# Patient Record
Sex: Female | Born: 1940
Health system: Southern US, Community
[De-identification: ages and names within clinical notes are randomized; demographics above are authoritative.]

## PROBLEM LIST (undated history)

## (undated) DIAGNOSIS — M949 Disorder of cartilage, unspecified: Secondary | ICD-10-CM

## (undated) DIAGNOSIS — F32A Depression, unspecified: Secondary | ICD-10-CM

## (undated) DIAGNOSIS — C801 Malignant (primary) neoplasm, unspecified: Secondary | ICD-10-CM

## (undated) DIAGNOSIS — F329 Major depressive disorder, single episode, unspecified: Secondary | ICD-10-CM

## (undated) DIAGNOSIS — H409 Unspecified glaucoma: Secondary | ICD-10-CM

## (undated) DIAGNOSIS — G43909 Migraine, unspecified, not intractable, without status migrainosus: Secondary | ICD-10-CM

## (undated) DIAGNOSIS — S82899A Other fracture of unspecified lower leg, initial encounter for closed fracture: Secondary | ICD-10-CM

## (undated) DIAGNOSIS — N951 Menopausal and female climacteric states: Secondary | ICD-10-CM

## (undated) DIAGNOSIS — K219 Gastro-esophageal reflux disease without esophagitis: Secondary | ICD-10-CM

## (undated) DIAGNOSIS — S62608D Fracture of unspecified phalanx of other finger, subsequent encounter for fracture with routine healing: Secondary | ICD-10-CM

## (undated) DIAGNOSIS — E785 Hyperlipidemia, unspecified: Secondary | ICD-10-CM

## (undated) DIAGNOSIS — F419 Anxiety disorder, unspecified: Secondary | ICD-10-CM

## (undated) HISTORY — DX: Other fracture of unspecified lower leg, initial encounter for closed fracture: S82.899A

## (undated) HISTORY — DX: Gastro-esophageal reflux disease without esophagitis: K21.9

## (undated) HISTORY — PX: TUBAL LIGATION: SHX77

## (undated) HISTORY — DX: Hyperlipidemia, unspecified: E78.5

## (undated) HISTORY — PX: ABDOMINAL HYSTERECTOMY: SHX81

## (undated) HISTORY — PX: TONSILLECTOMY: SUR1361

## (undated) HISTORY — DX: Unspecified glaucoma: H40.9

## (undated) HISTORY — DX: Menopausal and female climacteric states: N95.1

## (undated) HISTORY — DX: Fracture of unspecified phalanx of other finger, subsequent encounter for fracture with routine healing: S62.608D

## (undated) HISTORY — DX: Disorder of cartilage, unspecified: M94.9

## (undated) HISTORY — DX: Migraine, unspecified, not intractable, without status migrainosus: G43.909

---

## 1999-10-27 ENCOUNTER — Other Ambulatory Visit: Admission: RE | Admit: 1999-10-27 | Discharge: 1999-10-27 | Payer: Self-pay | Admitting: Gynecology

## 2001-01-29 ENCOUNTER — Other Ambulatory Visit: Admission: RE | Admit: 2001-01-29 | Discharge: 2001-01-29 | Payer: Self-pay | Admitting: Gynecology

## 2001-08-20 ENCOUNTER — Encounter: Payer: Self-pay | Admitting: Internal Medicine

## 2001-08-20 ENCOUNTER — Ambulatory Visit (HOSPITAL_COMMUNITY): Admission: RE | Admit: 2001-08-20 | Discharge: 2001-08-20 | Payer: Self-pay | Admitting: Internal Medicine

## 2002-04-07 ENCOUNTER — Other Ambulatory Visit: Admission: RE | Admit: 2002-04-07 | Discharge: 2002-04-07 | Payer: Self-pay | Admitting: Gynecology

## 2003-05-21 ENCOUNTER — Other Ambulatory Visit: Admission: RE | Admit: 2003-05-21 | Discharge: 2003-05-21 | Payer: Self-pay | Admitting: Gynecology

## 2004-06-16 ENCOUNTER — Other Ambulatory Visit: Admission: RE | Admit: 2004-06-16 | Discharge: 2004-06-16 | Payer: Self-pay | Admitting: Gynecology

## 2005-01-05 ENCOUNTER — Ambulatory Visit (HOSPITAL_COMMUNITY): Admission: RE | Admit: 2005-01-05 | Discharge: 2005-01-05 | Payer: Self-pay | Admitting: Internal Medicine

## 2005-06-12 ENCOUNTER — Other Ambulatory Visit: Admission: RE | Admit: 2005-06-12 | Discharge: 2005-06-12 | Payer: Self-pay | Admitting: Gynecology

## 2006-04-30 ENCOUNTER — Ambulatory Visit (HOSPITAL_COMMUNITY): Admission: RE | Admit: 2006-04-30 | Discharge: 2006-04-30 | Payer: Self-pay | Admitting: Internal Medicine

## 2006-04-30 ENCOUNTER — Ambulatory Visit: Payer: Self-pay | Admitting: Internal Medicine

## 2006-06-18 ENCOUNTER — Other Ambulatory Visit: Admission: RE | Admit: 2006-06-18 | Discharge: 2006-06-18 | Payer: Self-pay | Admitting: Gynecology

## 2006-09-04 ENCOUNTER — Ambulatory Visit (HOSPITAL_COMMUNITY): Admission: RE | Admit: 2006-09-04 | Discharge: 2006-09-04 | Payer: Self-pay | Admitting: Internal Medicine

## 2007-06-07 ENCOUNTER — Ambulatory Visit (HOSPITAL_COMMUNITY): Admission: RE | Admit: 2007-06-07 | Discharge: 2007-06-07 | Payer: Self-pay | Admitting: Internal Medicine

## 2007-06-25 ENCOUNTER — Other Ambulatory Visit: Admission: RE | Admit: 2007-06-25 | Discharge: 2007-06-25 | Payer: Self-pay | Admitting: Gynecology

## 2007-07-01 ENCOUNTER — Ambulatory Visit (HOSPITAL_COMMUNITY): Admission: RE | Admit: 2007-07-01 | Discharge: 2007-07-01 | Payer: Self-pay | Admitting: Ophthalmology

## 2007-09-09 ENCOUNTER — Ambulatory Visit (HOSPITAL_COMMUNITY): Admission: RE | Admit: 2007-09-09 | Discharge: 2007-09-09 | Payer: Self-pay | Admitting: Ophthalmology

## 2008-05-04 ENCOUNTER — Ambulatory Visit (HOSPITAL_COMMUNITY): Admission: RE | Admit: 2008-05-04 | Discharge: 2008-05-04 | Payer: Self-pay | Admitting: Internal Medicine

## 2008-06-10 ENCOUNTER — Ambulatory Visit (HOSPITAL_COMMUNITY): Admission: RE | Admit: 2008-06-10 | Discharge: 2008-06-10 | Payer: Self-pay | Admitting: Internal Medicine

## 2010-04-01 ENCOUNTER — Ambulatory Visit (HOSPITAL_COMMUNITY): Admission: RE | Admit: 2010-04-01 | Discharge: 2010-04-01 | Payer: Self-pay | Admitting: Internal Medicine

## 2011-02-10 NOTE — Op Note (Signed)
NAMEBRENAE, LASECKI                  ACCOUNT NO.:  [id number]   MEDICAL RECORD NO.:  [id number]          PATIENT TYPE:  AMB   LOCATION:  DAY                           FACILITY:  [facility]   PHYSICIAN:  Lionel December, M.D.    DATE OF BIRTH:  [birth year]   DATE OF PROCEDURE:  04/30/2006  DATE OF DISCHARGE:                                 OPERATIVE REPORT   PROCEDURE:  Colonoscopy.   Bridget Gay is a 70 year old Caucasian female who is here for high-risk screening  colonoscopy. Her last exam was 6 years ago. Her father died of colon  carcinoma. However, today she states that she is not convinced that he was  her biologic dad but she is agreeable to proceeding with colonoscopy. The  procedure was reviewed with the patient and informed consent was obtained.   MEDICATIONS FOR CONSCIOUS SEDATION:  Demerol 50 mg IV, Versed 5 mg IV.   FINDINGS:  Procedure performed in endoscopy suite. The patient's vital signs  and O2 saturations were monitored during the procedure and remained stable.  The patient was placed in the left lateral decubitus position and rectal  examination performed. No abnormalities noted on external or digital exam.  The Olympus videoscope was placed in the rectum and advanced under direct  vision into the sigmoid colon and beyond. The prep was excellent.  She had a  few small scattered diverticula at the sigmoid colon. The scope was passed  to the cecum which was identified by the appendiceal orifice and ileocecal  valve. Pictures taken for the record. Colonic mucosa was examined for the  second time on the way out and was normal throughout. The rectal mucosa was  normal. The scope was retroflexed to examine the anorectal junction and two  anal papilla were noted. Once again pictures taken for the record. The  endoscope was straightened and withdrawn. The patient tolerated the  procedure well.   FINAL DIAGNOSES:  A few small diverticula at the sigmoid colon and 2 anal  papilla,  otherwise normal exam.   RECOMMENDATIONS:  1.  High fiber diet.  2.  Yearly Hemoccults.   She may consider next exam in 5 years if she believes her family history,  otherwise come back in  10 years.      Lionel December, M.D.  Electronically Signed     NR/MEDQ  D:  04/30/2006  T:  04/30/2006  Job:  [id number]   cc:   Kingsley Callander. Ouida Sills, MD  Fax: [phone]

## 2011-05-03 ENCOUNTER — Encounter (INDEPENDENT_AMBULATORY_CARE_PROVIDER_SITE_OTHER): Payer: Self-pay | Admitting: *Deleted

## 2012-04-02 ENCOUNTER — Other Ambulatory Visit (HOSPITAL_COMMUNITY): Payer: Self-pay | Admitting: Internal Medicine

## 2012-04-02 DIAGNOSIS — Z139 Encounter for screening, unspecified: Secondary | ICD-10-CM

## 2012-04-08 ENCOUNTER — Ambulatory Visit (HOSPITAL_COMMUNITY)
Admission: RE | Admit: 2012-04-08 | Discharge: 2012-04-08 | Disposition: A | Discharge status: Home / Self Care | Payer: Medicare Other | Source: Ambulatory Visit | Attending: Internal Medicine | Admitting: Internal Medicine

## 2012-04-08 DIAGNOSIS — Z139 Encounter for screening, unspecified: Secondary | ICD-10-CM

## 2012-04-08 DIAGNOSIS — Z1231 Encounter for screening mammogram for malignant neoplasm of breast: Secondary | ICD-10-CM | POA: Insufficient documentation

## 2012-04-11 ENCOUNTER — Encounter (INDEPENDENT_AMBULATORY_CARE_PROVIDER_SITE_OTHER): Payer: Self-pay | Admitting: *Deleted

## 2012-09-06 ENCOUNTER — Other Ambulatory Visit (HOSPITAL_COMMUNITY): Payer: Self-pay | Admitting: Internal Medicine

## 2012-09-06 ENCOUNTER — Ambulatory Visit (HOSPITAL_COMMUNITY)
Admission: RE | Admit: 2012-09-06 | Discharge: 2012-09-06 | Disposition: A | Discharge status: Home / Self Care | Payer: Medicare Other | Source: Ambulatory Visit | Attending: Internal Medicine | Admitting: Internal Medicine

## 2012-09-06 DIAGNOSIS — M549 Dorsalgia, unspecified: Secondary | ICD-10-CM

## 2013-01-31 ENCOUNTER — Encounter (INDEPENDENT_AMBULATORY_CARE_PROVIDER_SITE_OTHER): Payer: Self-pay | Admitting: *Deleted

## 2013-02-06 ENCOUNTER — Encounter (INDEPENDENT_AMBULATORY_CARE_PROVIDER_SITE_OTHER): Payer: Self-pay | Admitting: *Deleted

## 2013-02-06 ENCOUNTER — Telehealth (INDEPENDENT_AMBULATORY_CARE_PROVIDER_SITE_OTHER): Payer: Self-pay | Admitting: *Deleted

## 2013-02-06 ENCOUNTER — Other Ambulatory Visit (INDEPENDENT_AMBULATORY_CARE_PROVIDER_SITE_OTHER): Payer: Self-pay | Admitting: *Deleted

## 2013-02-06 DIAGNOSIS — Z800 Family history of malignant neoplasm of digestive organs: Secondary | ICD-10-CM

## 2013-02-06 DIAGNOSIS — Z1211 Encounter for screening for malignant neoplasm of colon: Secondary | ICD-10-CM

## 2013-02-06 NOTE — Telephone Encounter (Signed)
Patient needs movi prep 

## 2013-02-07 MED ORDER — PEG-KCL-NACL-NASULF-NA ASC-C 100 G PO SOLR: 1.0000 | Freq: Once | ORAL | Status: DC

## 2013-03-11 ENCOUNTER — Telehealth (INDEPENDENT_AMBULATORY_CARE_PROVIDER_SITE_OTHER): Payer: Self-pay | Admitting: *Deleted

## 2013-03-11 NOTE — Telephone Encounter (Signed)
  Procedure: tcs  Reason/Indication:  fam hx colon ca  Has patient had this procedure before?  Yes, 2007 (EPIC)  If so, when, by whom and where?    Is there a family history of colon cancer?  Yes, father  Who?  What age when diagnosed?    Is patient diabetic?   no      Does patient have prosthetic heart valve?  no  Do you have a pacemaker?  no  Has patient ever had endocarditis? no  Has patient had joint replacement within last 12 months?  no  Is patient on Coumadin, Plavix and/or Aspirin? no  Medications: ibuprofen prn, welbutrin 150 mg bid, cetirizine 10 mg daily, chlordiazepoxide 25 mg prn, timolol eye drop bid  Allergies: sulfur  Medication Adjustment:   Procedure date & time: 04/10/13 at 830

## 2013-03-11 NOTE — Telephone Encounter (Signed)
agree

## 2013-03-17 ENCOUNTER — Encounter (HOSPITAL_COMMUNITY): Payer: Self-pay | Admitting: Pharmacy Technician

## 2013-04-10 ENCOUNTER — Encounter (HOSPITAL_COMMUNITY)
Admission: RE | Disposition: A | Discharge status: Home / Self Care | Payer: Self-pay | Source: Ambulatory Visit | Attending: Internal Medicine

## 2013-04-10 ENCOUNTER — Ambulatory Visit (HOSPITAL_COMMUNITY)
Admission: RE | Admit: 2013-04-10 | Discharge: 2013-04-10 | Disposition: A | Discharge status: Home / Self Care | Payer: Medicare PPO | Source: Ambulatory Visit | Attending: Internal Medicine | Admitting: Internal Medicine

## 2013-04-10 DIAGNOSIS — Z1211 Encounter for screening for malignant neoplasm of colon: Secondary | ICD-10-CM | POA: Insufficient documentation

## 2013-04-10 DIAGNOSIS — K5730 Diverticulosis of large intestine without perforation or abscess without bleeding: Secondary | ICD-10-CM | POA: Insufficient documentation

## 2013-04-10 DIAGNOSIS — K589 Irritable bowel syndrome without diarrhea: Secondary | ICD-10-CM | POA: Insufficient documentation

## 2013-04-10 DIAGNOSIS — Z882 Allergy status to sulfonamides status: Secondary | ICD-10-CM | POA: Insufficient documentation

## 2013-04-10 DIAGNOSIS — Z79899 Other long term (current) drug therapy: Secondary | ICD-10-CM | POA: Insufficient documentation

## 2013-04-10 DIAGNOSIS — K644 Residual hemorrhoidal skin tags: Secondary | ICD-10-CM

## 2013-04-10 DIAGNOSIS — K6389 Other specified diseases of intestine: Secondary | ICD-10-CM

## 2013-04-10 DIAGNOSIS — Z800 Family history of malignant neoplasm of digestive organs: Secondary | ICD-10-CM

## 2013-04-10 HISTORY — PX: COLONOSCOPY: SHX5424

## 2013-04-10 SURGERY — COLONOSCOPY
Anesthesia: Moderate Sedation

## 2013-04-10 MED ORDER — MEPERIDINE HCL 50 MG/ML IJ SOLN
INTRAMUSCULAR | Status: AC
  Filled 2013-04-10: qty 1

## 2013-04-10 MED ORDER — MEPERIDINE HCL 50 MG/ML IJ SOLN
INTRAMUSCULAR | Status: DC | PRN
  Administered 2013-04-10 (×2): 25 mg via INTRAVENOUS

## 2013-04-10 MED ORDER — MIDAZOLAM HCL 5 MG/5ML IJ SOLN
INTRAMUSCULAR | Status: AC
  Filled 2013-04-10: qty 10

## 2013-04-10 MED ORDER — SODIUM CHLORIDE 0.9 % IV SOLN
INTRAVENOUS | Status: DC
  Administered 2013-04-10: 08:00:00 via INTRAVENOUS

## 2013-04-10 MED ORDER — STERILE WATER FOR IRRIGATION IR SOLN
Status: DC | PRN
  Administered 2013-04-10: 08:00:00

## 2013-04-10 MED ORDER — MIDAZOLAM HCL 5 MG/5ML IJ SOLN
INTRAMUSCULAR | Status: DC | PRN
  Administered 2013-04-10 (×3): 2 mg via INTRAVENOUS

## 2013-04-10 NOTE — H&P (Addendum)
Bridget Gay is an 72 y.o. female.   Chief Complaint: Patient's here for colonoscopy. HPI: Patient 72 year old Caucasian female who is in for high risk screening colonoscopy. Patient's last exam was in August 2007. She denies abdominal pain or rectal bleeding. She has diarrhea and/or constipation. She has history of IBS. Family history significant for colon carcinoma in her father who died of liver metastases at age 68.  No past medical history on file.  No past surgical history on file.  No family history on file. Social History:  has no tobacco, alcohol, and drug history on file.  Allergies:  Allergies  Allergen Reactions  . Sulfa Antibiotics     unknown    Medications Prior to Admission  Medication Sig Dispense Refill  . buPROPion (WELLBUTRIN SR) 150 MG 12 hr tablet Take 150 mg by mouth 3 (three) times daily.      . cetirizine (ZYRTEC) 10 MG tablet Take 10 mg by mouth daily.        No results found for this or any previous visit (from the past 48 hour(s)). No results found.  ROS  Blood pressure 121/70, pulse 66, temperature 97.7 F (36.5 C), temperature source Oral, resp. rate 18, height 5\' 2"  (1.575 m), weight 145 lb (65.772 kg), SpO2 92.00%. Physical Exam  Constitutional: She appears well-developed and well-nourished.  HENT:  Mouth/Throat: Oropharynx is clear and moist.  Eyes: Conjunctivae are normal.  Neck: No thyromegaly present.  Cardiovascular: Normal rate, regular rhythm and normal heart sounds.   No murmur heard. Respiratory: Effort normal and breath sounds normal.  GI: Soft. She exhibits no distension and no mass. There is no tenderness.  Musculoskeletal: She exhibits no edema.  Lymphadenopathy:    She has no cervical adenopathy.  Neurological: She is alert.  Skin: Skin is warm and dry.     Assessment/Plan High-risk screening colonoscopy.  Kamyrah Feeser U 04/10/2013, 8:23 AM

## 2013-04-10 NOTE — Op Note (Signed)
COLONOSCOPY PROCEDURE REPORT  PATIENT:  Bridget Gay  MR#:  [id number] Birthdate:  [birth year], 72 y.o., female Endoscopist:  Dr. Malissa Hippo, MD Referred By:  Dr. Carylon Perches, MD Procedure Date: 04/10/2013  Procedure:   Colonoscopy  Indications:  Patient is 72 year old Caucasian female was undergoing screening colonoscopy. Family history significant for colon carcinoma in her father who died of metastatic disease at age 29. He was 51 at the time of diagnosis.  Informed Consent:  The procedure and risks were reviewed with the patient and informed consent was obtained.  Medications:  Demerol 50 mg IV Versed 6 mg IV  Description of procedure:  After a digital rectal exam was performed, that colonoscope was advanced from the anus through the rectum and colon to the area of the cecum, ileocecal valve and appendiceal orifice. The cecum was deeply intubated. These structures were well-seen and photographed for the record. From the level of the cecum and ileocecal valve, the scope was slowly and cautiously withdrawn. The mucosal surfaces were carefully surveyed utilizing scope tip to flexion to facilitate fold flattening as needed. The scope was pulled down into the rectum where a thorough exam including retroflexion was performed.  Findings:   Prep satisfactory. Small scattered diverticula at sigmoid colon. Normal rectal mucosa. Two small anal papillae and hemorrhoids below the dentate line.   Therapeutic/Diagnostic Maneuvers Performed:  None  Complications:  None  Cecal Withdrawal Time:  10 minutes  Impression:  Examination performed to cecum. Mild sigmoid colon diverticulosis. Small external hemorrhoids and 2 anal papillae. No evidence of colonic polyps.  Recommendations:  Standard instructions given. Next screening exam in 5 years.  [name]  04/10/2013 9:00 AM  CC: Dr. Carylon Perches, MD & Dr. Bonnetta Barry ref. provider found

## 2013-04-11 ENCOUNTER — Other Ambulatory Visit (HOSPITAL_COMMUNITY): Payer: Self-pay | Admitting: Internal Medicine

## 2013-04-11 DIAGNOSIS — Z139 Encounter for screening, unspecified: Secondary | ICD-10-CM

## 2013-04-15 ENCOUNTER — Ambulatory Visit (HOSPITAL_COMMUNITY)
Admission: RE | Admit: 2013-04-15 | Discharge: 2013-04-15 | Disposition: A | Discharge status: Home / Self Care | Payer: Medicare PPO | Source: Ambulatory Visit | Attending: Internal Medicine | Admitting: Internal Medicine

## 2013-04-15 ENCOUNTER — Encounter (HOSPITAL_COMMUNITY): Payer: Self-pay | Admitting: Internal Medicine

## 2013-04-15 DIAGNOSIS — Z139 Encounter for screening, unspecified: Secondary | ICD-10-CM

## 2013-04-15 DIAGNOSIS — Z1231 Encounter for screening mammogram for malignant neoplasm of breast: Secondary | ICD-10-CM | POA: Insufficient documentation

## 2014-04-17 ENCOUNTER — Other Ambulatory Visit (HOSPITAL_COMMUNITY): Payer: Self-pay | Admitting: Internal Medicine

## 2014-04-17 DIAGNOSIS — Z1231 Encounter for screening mammogram for malignant neoplasm of breast: Secondary | ICD-10-CM

## 2014-04-23 ENCOUNTER — Ambulatory Visit (HOSPITAL_COMMUNITY): Payer: Medicare PPO

## 2014-10-14 DIAGNOSIS — H26493 Other secondary cataract, bilateral: Secondary | ICD-10-CM | POA: Diagnosis not present

## 2014-10-14 DIAGNOSIS — H4011X2 Primary open-angle glaucoma, moderate stage: Secondary | ICD-10-CM | POA: Diagnosis not present

## 2014-10-14 DIAGNOSIS — H4011X1 Primary open-angle glaucoma, mild stage: Secondary | ICD-10-CM | POA: Diagnosis not present

## 2014-10-14 DIAGNOSIS — Z961 Presence of intraocular lens: Secondary | ICD-10-CM | POA: Diagnosis not present

## 2014-12-21 ENCOUNTER — Ambulatory Visit (INDEPENDENT_AMBULATORY_CARE_PROVIDER_SITE_OTHER): Payer: Medicare PPO | Admitting: Internal Medicine

## 2014-12-21 ENCOUNTER — Encounter (INDEPENDENT_AMBULATORY_CARE_PROVIDER_SITE_OTHER): Payer: Self-pay | Admitting: Internal Medicine

## 2014-12-21 VITALS — BP 124/70 | HR 76 | Temp 97.5°F | Ht 62.0 in | Wt 142.0 lb

## 2014-12-21 DIAGNOSIS — R197 Diarrhea, unspecified: Secondary | ICD-10-CM | POA: Diagnosis not present

## 2014-12-21 NOTE — Patient Instructions (Signed)
GI pathogen. Imodium one in am and one in pm.

## 2014-12-21 NOTE — Progress Notes (Addendum)
   Subjective:    Patient ID: Bridget Gay, female    DOB: September 07, 1941, 74 y.o.   MRN: [id number]  HPI 74 yr old female presenting today with c/o of not having a solid stool since 12/10/2014. The diarrhea started gradually started in January. She is having several stools a day. The worst is immediately after lunch. There has been no BRRB or melena.  She has had one solid stool today and one solid stool yesterday. She is not having diarrhea everyday but she says almost every day. She has been incontinent x 1 while in a public place.  She traveled to [place] in February 13 thru March 2nd. She did not have diarrhea while on her trip. She says with the diarrhea she will have migraines.  Appetite is good for the most part. No weight loss. She also says she has frequent acid reflux.   She takes OTC Zantac as needed. No abdominal pain today. She has rt sided abdominal pain just before her BMs. She also has a lot of dizziness when she turns over.    04/10/2013: Colonoscopy Indications: Patient is 74 year old Caucasian female was undergoing screening colonoscopy. Family history significant for colon carcinoma in her father who died of metastatic disease at age 56. He was 51 at the time of diagnosis. Impression:  Examination performed to cecum. Mild sigmoid colon diverticulosis. Small external hemorrhoids and 2 anal papillae. No evidence of colonic polyps.  Review of Systems     Objective:   Physical Exam Blood pressure 124/70, pulse 76, temperature 97.5 F (36.4 C), height 5\' 2"  (1.575 m), weight 142 lb (64.411 kg). Alert and oriented. Skin warm and dry. Oral mucosa is moist.   . Sclera anicteric, conjunctivae is pink. Thyroid not enlarged. No cervical lymphadenopathy. Lungs clear. Heart regular rate and rhythm.  Abdomen is soft. Bowel sounds are positive. No hepatomegaly. No abdominal masses felt. No tenderness.  No edema to lower extremities.          Assessment & Plan:  Diarrhea.  Possible IBS. Will get GI pathogen. CBC and CRP Imodium BID.

## 2014-12-22 LAB — GASTROINTESTINAL PATHOGEN PANEL PCR
C. difficile Tox A/B, PCR: NEGATIVE
CRYPTOSPORIDIUM, PCR: NEGATIVE
Campylobacter, PCR: NEGATIVE
E COLI (STEC) STX1/STX2, PCR: NEGATIVE
E COLI 0157, PCR: NEGATIVE
E coli (ETEC) LT/ST PCR: NEGATIVE
GIARDIA LAMBLIA, PCR: NEGATIVE
NOROVIRUS, PCR: NEGATIVE
Rotavirus A, PCR: NEGATIVE
SALMONELLA, PCR: NEGATIVE
Shigella, PCR: NEGATIVE

## 2014-12-22 LAB — CBC WITH DIFFERENTIAL/PLATELET
BASOS ABS: 0 10*3/uL (ref 0.0–0.1)
Basophils Relative: 0 % (ref 0–1)
EOS ABS: 0.1 10*3/uL (ref 0.0–0.7)
Eosinophils Relative: 1 % (ref 0–5)
HEMATOCRIT: 37.6 % (ref 36.0–46.0)
HEMOGLOBIN: 12.4 g/dL (ref 12.0–15.0)
LYMPHS PCT: 20 % (ref 12–46)
Lymphs Abs: 1.6 10*3/uL (ref 0.7–4.0)
MCH: 29.9 pg (ref 26.0–34.0)
MCHC: 33 g/dL (ref 30.0–36.0)
MCV: 90.6 fL (ref 78.0–100.0)
MPV: 11.4 fL (ref 8.6–12.4)
Monocytes Absolute: 0.7 10*3/uL (ref 0.1–1.0)
Monocytes Relative: 9 % (ref 3–12)
Neutro Abs: 5.6 10*3/uL (ref 1.7–7.7)
Neutrophils Relative %: 70 % (ref 43–77)
PLATELETS: 281 10*3/uL (ref 150–400)
RBC: 4.15 MIL/uL (ref 3.87–5.11)
RDW: 13.8 % (ref 11.5–15.5)
WBC: 8 10*3/uL (ref 4.0–10.5)

## 2014-12-22 LAB — C-REACTIVE PROTEIN: CRP: <0.5 mg/dL (ref <0.60)

## 2014-12-29 ENCOUNTER — Telehealth (INDEPENDENT_AMBULATORY_CARE_PROVIDER_SITE_OTHER): Payer: Self-pay | Admitting: *Deleted

## 2014-12-29 NOTE — Telephone Encounter (Signed)
Patient called in and was worried about her results since she hadn't heard in a week.  I glanced at them and they appeared OK however I told her I would give message to you that she called and if we needed to give her additional information we would let her know.

## 2014-12-29 NOTE — Telephone Encounter (Signed)
This message is for Ms.Setzer to address

## 2014-12-30 NOTE — Telephone Encounter (Signed)
I have spoken with patient earlier yesterday. She is better. She will call me the end of the week .

## 2014-12-30 NOTE — Telephone Encounter (Signed)
Results have been given to patient. She will call Thursday

## 2015-03-10 DIAGNOSIS — L821 Other seborrheic keratosis: Secondary | ICD-10-CM | POA: Diagnosis not present

## 2015-03-10 DIAGNOSIS — D1801 Hemangioma of skin and subcutaneous tissue: Secondary | ICD-10-CM | POA: Diagnosis not present

## 2015-03-10 DIAGNOSIS — L820 Inflamed seborrheic keratosis: Secondary | ICD-10-CM | POA: Diagnosis not present

## 2015-03-10 DIAGNOSIS — L814 Other melanin hyperpigmentation: Secondary | ICD-10-CM | POA: Diagnosis not present

## 2015-03-10 DIAGNOSIS — L570 Actinic keratosis: Secondary | ICD-10-CM | POA: Diagnosis not present

## 2015-04-09 DIAGNOSIS — Z79899 Other long term (current) drug therapy: Secondary | ICD-10-CM | POA: Diagnosis not present

## 2015-04-09 DIAGNOSIS — F329 Major depressive disorder, single episode, unspecified: Secondary | ICD-10-CM | POA: Diagnosis not present

## 2015-04-09 DIAGNOSIS — G43909 Migraine, unspecified, not intractable, without status migrainosus: Secondary | ICD-10-CM | POA: Diagnosis not present

## 2015-04-09 DIAGNOSIS — E785 Hyperlipidemia, unspecified: Secondary | ICD-10-CM | POA: Diagnosis not present

## 2015-04-21 DIAGNOSIS — H04123 Dry eye syndrome of bilateral lacrimal glands: Secondary | ICD-10-CM | POA: Diagnosis not present

## 2015-04-21 DIAGNOSIS — H4011X1 Primary open-angle glaucoma, mild stage: Secondary | ICD-10-CM | POA: Diagnosis not present

## 2015-04-21 DIAGNOSIS — H26493 Other secondary cataract, bilateral: Secondary | ICD-10-CM | POA: Diagnosis not present

## 2015-04-21 DIAGNOSIS — H10413 Chronic giant papillary conjunctivitis, bilateral: Secondary | ICD-10-CM | POA: Diagnosis not present

## 2015-04-21 DIAGNOSIS — Z961 Presence of intraocular lens: Secondary | ICD-10-CM | POA: Diagnosis not present

## 2015-04-21 DIAGNOSIS — G43909 Migraine, unspecified, not intractable, without status migrainosus: Secondary | ICD-10-CM | POA: Diagnosis not present

## 2015-04-22 DIAGNOSIS — G43909 Migraine, unspecified, not intractable, without status migrainosus: Secondary | ICD-10-CM | POA: Diagnosis not present

## 2015-04-22 DIAGNOSIS — Z0001 Encounter for general adult medical examination with abnormal findings: Secondary | ICD-10-CM | POA: Diagnosis not present

## 2015-04-22 DIAGNOSIS — Z6826 Body mass index (BMI) 26.0-26.9, adult: Secondary | ICD-10-CM | POA: Diagnosis not present

## 2015-04-22 DIAGNOSIS — H8113 Benign paroxysmal vertigo, bilateral: Secondary | ICD-10-CM | POA: Diagnosis not present

## 2015-05-05 ENCOUNTER — Ambulatory Visit (HOSPITAL_COMMUNITY)
Admission: RE | Admit: 2015-05-05 | Discharge: 2015-05-05 | Disposition: A | Discharge status: Home / Self Care | Payer: Medicare PPO | Source: Ambulatory Visit | Attending: Internal Medicine | Admitting: Internal Medicine

## 2015-05-05 DIAGNOSIS — Z1231 Encounter for screening mammogram for malignant neoplasm of breast: Secondary | ICD-10-CM | POA: Diagnosis not present

## 2015-07-06 DIAGNOSIS — Z23 Encounter for immunization: Secondary | ICD-10-CM | POA: Diagnosis not present

## 2015-08-30 DIAGNOSIS — R42 Dizziness and giddiness: Secondary | ICD-10-CM | POA: Diagnosis not present

## 2015-08-30 DIAGNOSIS — H903 Sensorineural hearing loss, bilateral: Secondary | ICD-10-CM | POA: Diagnosis not present

## 2015-08-30 DIAGNOSIS — H6123 Impacted cerumen, bilateral: Secondary | ICD-10-CM | POA: Diagnosis not present

## 2015-08-30 DIAGNOSIS — H8112 Benign paroxysmal vertigo, left ear: Secondary | ICD-10-CM | POA: Diagnosis not present

## 2016-06-13 ENCOUNTER — Ambulatory Visit (HOSPITAL_COMMUNITY): Payer: Medicare PPO | Attending: Physical Therapy | Admitting: Physical Therapy

## 2016-06-20 ENCOUNTER — Ambulatory Visit (HOSPITAL_COMMUNITY): Payer: Medicare Other | Attending: Orthopaedic Surgery | Admitting: Physical Therapy

## 2016-06-20 DIAGNOSIS — R29898 Other symptoms and signs involving the musculoskeletal system: Secondary | ICD-10-CM | POA: Diagnosis present

## 2016-06-20 DIAGNOSIS — M6281 Muscle weakness (generalized): Secondary | ICD-10-CM

## 2016-06-20 DIAGNOSIS — M545 Low back pain: Secondary | ICD-10-CM | POA: Diagnosis present

## 2016-06-20 NOTE — Therapy (Addendum)
[place], [place], [address] Phone: [phone]   Fax:  [phone]  Physical Therapy Evaluation  Patient Details  Name: Bridget Gay MRN: [id number] Date of Birth: 03-15-1941 Referring Provider: Joni Fears   Encounter Date: 06/20/2016      PT End of Session - 06/20/16 1726    Visit Number 1   Number of Visits 5   Date for PT Re-Evaluation 07/18/16   Authorization Type [employer] Time Period 06/20/16 to 07/25/16   PT Start Time 1517   PT Stop Time 1602   PT Time Calculation (min) 45 min   Activity Tolerance Patient tolerated treatment well   Behavior During Therapy [facility] for tasks assessed/performed      Past Medical History:  Diagnosis Date  . Fracture of unsp phalanx of finger, subs for fx w routn heal    from a fall this year  . Fx ankle     Past Surgical History:  Procedure Laterality Date  . COLONOSCOPY N/A 04/10/2013   Procedure: COLONOSCOPY;  Surgeon: Rogene Houston, MD;  Location: [facility]ITE;  Service: Endoscopy;  Laterality: N/A;  830    There were no vitals filed for this visit.       Subjective Assessment - 06/20/16 1521    Subjective Patient states that 2-2.5 years ago she was in [place] and she fell down some stairs head first, she hit her head and her hip. She still has a pocket of edema on her L hip about the size of a football. She recently had some x-rays done on her hip and it showed no fractures, however around this time her back started tingling- the next day she could not pick up a box from the floor and the next 3 days she spent in bed. She then went to her MD, who sent her to PT. Her back has been feeling better in general since the initial flare up, she is now feeling mostly tingling but no pain. Her back started bothering her about 2 weeks before labor day. Some tingling in the midline of her back, no incontinence/bowel/bladder issues. No falls  recently but she states that her balance is not what it once was, especially at night.    Pertinent History no significant PMH or PSH    How long can you sit comfortably? unlimited    How long can you stand comfortably? unlimited    How long can you walk comfortably? unlimited    Diagnostic tests unable to access imaging, however per patient MD says no bone damage and plenty of OA    Patient Stated Goals prevent recurrence of back pain    Currently in Pain? No/denies            [facility] PT Assessment - 06/20/16 0001      Assessment   Medical Diagnosis lumbar DDD    Referring Provider Joni Fears    Onset Date/Surgical Date --  mid-August    Next MD Visit none scheduled with Dr. Durward Fortes      Precautions   Precautions None   Precaution Comments no known precautions      Balance Screen   Has the patient fallen in the past 6 months Yes   How many times? 1- due to vertigo that is now resolved    Has the patient had a decrease in activity level because of a fear of falling?  No  Is the patient reluctant to leave their home because of a fear of falling?  No     Prior Function   Level of Independence Independent;Independent with basic ADLs;Independent with gait;Independent with transfers   Vocation Retired     Observation/Other Assessments   Observations large collection of fluid lateral R LE, which patient reports is chronic since her fall 2-2.5 years ago; tender to palpation on outer edges but not in the center      AROM   Overall AROM Comments hip ROM WFL B; however hip flexion and FABER (+) for groin pain R hip    Lumbar Flexion knuckles to floor    Lumbar Extension WFL    Lumbar - Right Side Bend fingertips to midline of knee    Lumbar - Left Side Bend fingertips approx 2 inches above knee midline      Strength   Right Hip Flexion 4/5   Right Hip ABduction 4-/5   Left Hip Flexion 4/5   Left Hip ABduction 4/5   Right Knee Flexion 4+/5   Right Knee Extension 4+/5    Left Knee Flexion 4+/5   Left Knee Extension 4+/5   Right Ankle Dorsiflexion 4+/5   Left Ankle Dorsiflexion 5/5     Palpation   Palpation comment hypomobility noted with lumbar, sacral, and low thoracic PAs     Ambulation/Gait   Gait Comments proximal muscle weakness      6 minute walk test results    Aerobic Endurance Distance Walked 748   Endurance additional comments 3MWT      High Level Balance   High Level Balance Comments SLS 4-6 seconds best B      No changes in symptoms with repeated movements (flexion or extension) of lumbar spine                       PT Education - 06/20/16 1726    Education provided Yes   Education Details prognosis, POC, HEP    Person(s) Educated Patient   Methods Explanation;Handout;Verbal cues   Comprehension Verbalized understanding;Returned demonstration;Need further instruction          PT Short Term Goals - 06/20/16 1736      PT SHORT TERM GOAL #1   Title Patient to demonstrate full lumbar ROM on all measured planes with no discomfort in order to improve general QOL and mobility    Time 2   Period Weeks   Status New     PT SHORT TERM GOAL #2   Title Patient to maintain correct posture at least 70% of the time in order to reduce lumbar discomfort and improve mechanics    Time 2   Period Weeks   Status New     PT SHORT TERM GOAL #3   Title Patient to be able to maintain SLS at least 20 seconds bilaterally with no UE support in order to show improved balance/reduced fall risk    Time 2   Period Weeks   Status New     PT SHORT TERM GOAL #4   Title Patient to correctly and consistently perform appropriate HEP, to be updated PRN    Time 1   Period Weeks   Status New           PT Long Term Goals - 06/20/16 1738      PT LONG TERM GOAL #1   Title Patient to demonstrate strength 5/5 in order to assist in improving functional mechanics and  preventing re-occurrence of pain    Time 5   Period Weeks    Status New     PT LONG TERM GOAL #2   Title Patient to be able to maintain SLS for 30 seconds bilaterally and will be able to identify at least 5 safety precautions/ways to prevent falls in order to show overall reduced fall risk and improved safety awareness    Time 5   Period Weeks   Status New     PT LONG TERM GOAL #3   Title Patient to demonstrate correct form with functional lifting tasks from the floor in order to prevent exacerbation of symptoms or re-injury of lumbar area    Time 5   Period Weeks   Status New     PT LONG TERM GOAL #4   Title Patient to be participatory in a regular aerobic exercise program, at least 20 minutes in duration, at least 4 days per week in order to maintain functional gains and improve general health status    Time 5   Period Weeks   Status New               Plan - 06/20/16 1733    Clinical Impression Statement Patient arrives today complaining of low back pain, which started in late August (approximately 2 weeks before Labor Day per patient), which started as a tingle and kept her in bed for approximately 3 days before she went to see her MD, who referred her to PT. She reports her back is feeling much better and that she is actually feeling good enough that she is wondering if she really still needs PT. Upon examination, patient reveals postural deviations, functional weakness especially proximally, hypomobility and some functional stiffness of her lumbar and thoracic spines/sacrum, and also significant unsteadiness. She does have a history of a major fall down a flight of stairs that occurred when she was travelling in [place], which left her with a concussion, some R hip pain as well as a large pocked of edema on her lateral R LE. She has never received an MRI for her low back or R hip per her report. At this time, recommend a short stint of skilled PT services to address functional limitations, minimize fall risk, and reach optimal level of  function.    Rehab Potential Good   Clinical Impairments Affecting Rehab Potential memory deficits related to prior concussion, history of major fall down stairs that left large pocket of edema lateral R LE    PT Frequency 1x / week   PT Duration Other (comment)  5 weeks    PT Treatment/Interventions ADLs/Self Care Home Management;Biofeedback;Cryotherapy;Moist Heat;Gait training;Stair training;Functional mobility training;Therapeutic activities;Therapeutic exercise;Balance training;Neuromuscular re-education;Patient/family education;Manual techniques;Passive range of motion;Energy conservation;Taping   PT Next Visit Plan review HEP and goals; proximal strength, lumbar PAs and manual to tolerance, lumbar mobility, balance. UPDATE HEP EACH SESSION.    PT Home Exercise Plan 09/26: bridge, supine hip ABD with red TB, sit to stand with no UEs, tandem stance    Consulted and Agree with Plan of Care Patient      Patient will benefit from skilled therapeutic intervention in order to improve the following deficits and impairments:  Abnormal gait, Improper body mechanics, Pain, Decreased coordination, Postural dysfunction, Decreased strength, Hypomobility, Decreased balance, Decreased safety awareness  Visit Diagnosis: Midline low back pain, with sciatica presence unspecified - Plan: PT plan of care cert/re-cert  Muscle weakness (generalized) - Plan: PT plan of care cert/re-cert  Other symptoms and signs involving the musculoskeletal system - Plan: PT plan of care cert/re-cert   G-codes Mobility and Walking Around 06/20/16 Based on strength, balance, gait, ROM Current CJ Goal CI   Problem List There are no active problems to display for this patient.   Deniece Ree PT, DPT [phone]  [place] [address] [place], [place], [address] Phone: [phone]   Fax:  [phone]  Name: AZENET DONATI MRN: [id number] Date of Birth: 1940/11/22

## 2016-06-20 NOTE — Patient Instructions (Signed)
    BRIDGING  While lying on your back, tighten your lower abdominals, squeeze your buttocks and then raise your buttocks off the floor/bed as creating a "Bridge" with your body. Hold for 1 second and then lower yourself and repeat 10-15 times, twice a day.   ELASTIC BAND - SUPINE HIP ABDUCTION  While lying on your back and your band around your ankles, slowly bring your leg out to the side. Keep  your knee straight the entire time.  Repeat 10 times each side, twice a day.     SIT TO STAND - NO SUPPORT  Start by scooting close to the front of the chair.  Next, lean forward at your trunk and reach forward with your arms and rise to standing without using your hands to push off from the chair or other object.  Repeat 10 times, twice a day.      TANDEM STANCE WITH SUPPORT  Stand in front of a chair, table or counter top for support. Then place the heel of one foot so that it is touching the toes of the other foot. Maintain your balance in this position for as long as you can.  Repeat twice each side, twice a day.

## 2016-06-28 ENCOUNTER — Ambulatory Visit (HOSPITAL_COMMUNITY): Payer: Self-pay

## 2016-06-28 ENCOUNTER — Telehealth (HOSPITAL_COMMUNITY): Payer: Self-pay

## 2016-06-28 NOTE — Telephone Encounter (Signed)
No show, called and spoke to pt who stated she had called previously and left message.  Stated she wishes to cancel all further apts., has been completeing HEP and feels better now.    [address] [place]; [facility] [phone]

## 2016-06-28 NOTE — Telephone Encounter (Signed)
10/4  Patient left [place] a message to say that she was cancelling today because she hadn't done her homework

## 2016-07-05 ENCOUNTER — Encounter (HOSPITAL_COMMUNITY): Payer: Self-pay | Admitting: Physical Therapy

## 2016-07-12 ENCOUNTER — Encounter (HOSPITAL_COMMUNITY): Payer: Self-pay | Admitting: Physical Therapy

## 2016-07-19 ENCOUNTER — Encounter (HOSPITAL_COMMUNITY): Payer: Self-pay | Admitting: Physical Therapy

## 2016-07-26 ENCOUNTER — Encounter (HOSPITAL_COMMUNITY): Payer: Self-pay | Admitting: Physical Therapy

## 2016-10-23 ENCOUNTER — Encounter (HOSPITAL_COMMUNITY): Payer: Self-pay | Admitting: Physical Therapy

## 2016-10-23 NOTE — Therapy (Signed)
[place], [place], [address] Phone: [phone]   Fax:  [phone]  Patient Details  Name: Bridget Gay MRN: [id number] Date of Birth: Jan 23, 1941 Referring Provider:  No ref. provider found  Encounter Date: 10/23/2016   PHYSICAL THERAPY DISCHARGE SUMMARY  Visits from Start of Care: 1  Current functional level related to goals / functional outcomes: Patient has not returned since last scheduled session    Remaining deficits: Unable to assess    Education / Equipment: N/A  Plan: Patient agrees to discharge.  Patient goals were not met. Patient is being discharged due to not returning since the last visit.  ?????     Deniece Ree PT, DPT [place] [address] [place], [place], [address] Phone: [phone]   Fax:  [phone]

## 2017-05-02 ENCOUNTER — Telehealth (INDEPENDENT_AMBULATORY_CARE_PROVIDER_SITE_OTHER): Payer: Self-pay | Admitting: Orthopaedic Surgery

## 2017-05-02 NOTE — Telephone Encounter (Signed)
Patient would like a copy of her xrays taken in September 2017 at [facility] office. Please call patient when ready to pick up. Patient would like to pick this up at the [facility] office.

## 2017-05-03 NOTE — Telephone Encounter (Signed)
LVMOM for Baldo Ash to get these ready and call pt

## 2017-05-22 ENCOUNTER — Other Ambulatory Visit (HOSPITAL_COMMUNITY): Payer: Self-pay | Admitting: Internal Medicine

## 2017-05-22 DIAGNOSIS — Z780 Asymptomatic menopausal state: Secondary | ICD-10-CM

## 2017-05-31 ENCOUNTER — Ambulatory Visit (HOSPITAL_COMMUNITY)
Admission: RE | Admit: 2017-05-31 | Discharge: 2017-05-31 | Disposition: A | Discharge status: Home / Self Care | Payer: Medicare Other | Source: Ambulatory Visit | Attending: Internal Medicine | Admitting: Internal Medicine

## 2017-05-31 ENCOUNTER — Other Ambulatory Visit (HOSPITAL_COMMUNITY): Payer: Self-pay | Admitting: Internal Medicine

## 2017-05-31 DIAGNOSIS — Z780 Asymptomatic menopausal state: Secondary | ICD-10-CM | POA: Diagnosis present

## 2017-05-31 DIAGNOSIS — Z1231 Encounter for screening mammogram for malignant neoplasm of breast: Secondary | ICD-10-CM

## 2017-05-31 DIAGNOSIS — M818 Other osteoporosis without current pathological fracture: Secondary | ICD-10-CM | POA: Diagnosis not present

## 2017-06-04 ENCOUNTER — Encounter (INDEPENDENT_AMBULATORY_CARE_PROVIDER_SITE_OTHER): Payer: Self-pay | Admitting: Internal Medicine

## 2017-06-05 ENCOUNTER — Encounter (INDEPENDENT_AMBULATORY_CARE_PROVIDER_SITE_OTHER): Payer: Self-pay | Admitting: *Deleted

## 2017-06-05 ENCOUNTER — Ambulatory Visit (INDEPENDENT_AMBULATORY_CARE_PROVIDER_SITE_OTHER): Payer: Medicare Other | Admitting: Internal Medicine

## 2017-06-05 ENCOUNTER — Other Ambulatory Visit (INDEPENDENT_AMBULATORY_CARE_PROVIDER_SITE_OTHER): Payer: Self-pay | Admitting: Internal Medicine

## 2017-06-05 ENCOUNTER — Encounter (INDEPENDENT_AMBULATORY_CARE_PROVIDER_SITE_OTHER): Payer: Self-pay | Admitting: Internal Medicine

## 2017-06-05 DIAGNOSIS — K219 Gastro-esophageal reflux disease without esophagitis: Secondary | ICD-10-CM | POA: Insufficient documentation

## 2017-06-05 HISTORY — DX: Gastro-esophageal reflux disease without esophagitis: K21.9

## 2017-06-05 NOTE — Patient Instructions (Signed)
EGD. GERD diet given to patient.

## 2017-06-05 NOTE — Progress Notes (Signed)
   Subjective:    Patient ID: Bridget Gay, female    DOB: [birth year], 76 y.o.   MRN: [id number]  HPI REferred by DR. Fagan for GERD/EGD.  She tells me she has a lot of reflux in the last 2 yrs. When she lies down, she has pressure in her sternal area.  Occasionally the reflux comes up into her mouth. She says the Protonix is helping her GERD. She has diarrhea and states the Protonix and Dicyclomine helps. Her appetite is good. No weight  Loss. Has a BM daily. No melena or BRRB. No NSAIDs  05/08/2017 H and H 12.3 and 37.3  Review of Systems Past Medical History:  Diagnosis Date  . Fracture of unsp phalanx of finger, subs for fx w routn heal    from a fall this year  . Fx ankle   . GERD (gastroesophageal reflux disease) 06/05/2017    Past Surgical History:  Procedure Laterality Date  . COLONOSCOPY N/A 04/10/2013   Procedure: COLONOSCOPY;  Surgeon: Rogene Houston, MD;  [facility];  Service: Endoscopy;  Laterality: N/A;  830    Allergies  Allergen Reactions  . Sulfa Antibiotics     unknown    Current Outpatient Prescriptions on File Prior to Visit  Medication Sig Dispense Refill  . buPROPion (WELLBUTRIN SR) 150 MG 12 hr tablet Take 150 mg by mouth 2 (two) times daily.     . cetirizine (ZYRTEC) 10 MG tablet Take 10 mg by mouth as needed.     . chlordiazePOXIDE (LIBRIUM) 25 MG capsule Take 25 mg by mouth as needed for anxiety.    . timolol (BETIMOL) 0.25 % ophthalmic solution 1 drop 2 (two) times daily.     No current facility-administered medications on file prior to visit.         Objective:   Physical Exam Blood pressure (!) 142/84, pulse 72, temperature 97.7 F (36.5 C), height 5\' 2"  (1.575 m), weight 146 lb 8 oz (66.5 kg). Alert and oriented. Skin warm and dry. Oral mucosa is moist.   . Sclera anicteric, conjunctivae is pink. Thyroid not enlarged. No cervical lymphadenopathy. Lungs clear. Heart regular rate and rhythm.  Abdomen is soft. Bowel sounds are  positive. No hepatomegaly. No abdominal masses felt. No tenderness.  No edema to lower extremities. Patient is alert and oriented.         Assessment & Plan:  GERD. Continue the Protonix.  GERD diet given to patient.

## 2017-06-13 ENCOUNTER — Ambulatory Visit (HOSPITAL_COMMUNITY)
Admission: RE | Admit: 2017-06-13 | Discharge: 2017-06-13 | Disposition: A | Discharge status: Home / Self Care | Payer: Medicare Other | Source: Ambulatory Visit | Attending: Internal Medicine | Admitting: Internal Medicine

## 2017-06-13 DIAGNOSIS — Z1231 Encounter for screening mammogram for malignant neoplasm of breast: Secondary | ICD-10-CM | POA: Insufficient documentation

## 2017-06-14 ENCOUNTER — Other Ambulatory Visit (HOSPITAL_COMMUNITY): Payer: Self-pay | Admitting: Internal Medicine

## 2017-06-14 DIAGNOSIS — R928 Other abnormal and inconclusive findings on diagnostic imaging of breast: Secondary | ICD-10-CM

## 2017-06-19 ENCOUNTER — Ambulatory Visit (HOSPITAL_COMMUNITY)
Admission: RE | Admit: 2017-06-19 | Discharge: 2017-06-19 | Disposition: A | Discharge status: Home / Self Care | Payer: Medicare Other | Source: Ambulatory Visit | Attending: Internal Medicine | Admitting: Internal Medicine

## 2017-06-19 DIAGNOSIS — R928 Other abnormal and inconclusive findings on diagnostic imaging of breast: Secondary | ICD-10-CM | POA: Diagnosis not present

## 2017-06-19 DIAGNOSIS — N6489 Other specified disorders of breast: Secondary | ICD-10-CM | POA: Diagnosis present

## 2017-06-29 ENCOUNTER — Encounter (HOSPITAL_COMMUNITY): Payer: Self-pay | Admitting: *Deleted

## 2017-06-29 ENCOUNTER — Ambulatory Visit (HOSPITAL_COMMUNITY)
Admission: RE | Admit: 2017-06-29 | Discharge: 2017-06-29 | Disposition: A | Discharge status: Home / Self Care | Payer: Medicare Other | Source: Ambulatory Visit | Attending: Internal Medicine | Admitting: Internal Medicine

## 2017-06-29 ENCOUNTER — Encounter (HOSPITAL_COMMUNITY)
Admission: RE | Disposition: A | Discharge status: Home / Self Care | Payer: Self-pay | Source: Ambulatory Visit | Attending: Internal Medicine

## 2017-06-29 DIAGNOSIS — Z882 Allergy status to sulfonamides status: Secondary | ICD-10-CM | POA: Insufficient documentation

## 2017-06-29 DIAGNOSIS — K219 Gastro-esophageal reflux disease without esophagitis: Secondary | ICD-10-CM

## 2017-06-29 DIAGNOSIS — F329 Major depressive disorder, single episode, unspecified: Secondary | ICD-10-CM | POA: Insufficient documentation

## 2017-06-29 DIAGNOSIS — F419 Anxiety disorder, unspecified: Secondary | ICD-10-CM | POA: Diagnosis not present

## 2017-06-29 DIAGNOSIS — K449 Diaphragmatic hernia without obstruction or gangrene: Secondary | ICD-10-CM

## 2017-06-29 DIAGNOSIS — Z881 Allergy status to other antibiotic agents status: Secondary | ICD-10-CM | POA: Insufficient documentation

## 2017-06-29 DIAGNOSIS — Z79899 Other long term (current) drug therapy: Secondary | ICD-10-CM | POA: Insufficient documentation

## 2017-06-29 DIAGNOSIS — R1314 Dysphagia, pharyngoesophageal phase: Secondary | ICD-10-CM

## 2017-06-29 HISTORY — PX: ESOPHAGOGASTRODUODENOSCOPY: SHX5428

## 2017-06-29 HISTORY — DX: Depression, unspecified: F32.A

## 2017-06-29 HISTORY — DX: Anxiety disorder, unspecified: F41.9

## 2017-06-29 HISTORY — DX: Major depressive disorder, single episode, unspecified: F32.9

## 2017-06-29 SURGERY — EGD (ESOPHAGOGASTRODUODENOSCOPY)
Anesthesia: Moderate Sedation

## 2017-06-29 MED ORDER — SODIUM CHLORIDE 0.9 % IV SOLN
INTRAVENOUS | Status: DC
  Administered 2017-06-29: 07:00:00 via INTRAVENOUS

## 2017-06-29 MED ORDER — MEPERIDINE HCL 50 MG/ML IJ SOLN
INTRAMUSCULAR | Status: DC | PRN
  Administered 2017-06-29 (×2): 25 mg via INTRAVENOUS

## 2017-06-29 MED ORDER — MIDAZOLAM HCL 5 MG/5ML IJ SOLN
INTRAMUSCULAR | Status: AC
  Filled 2017-06-29: qty 10

## 2017-06-29 MED ORDER — MEPERIDINE HCL 50 MG/ML IJ SOLN
INTRAMUSCULAR | Status: AC
  Filled 2017-06-29: qty 1

## 2017-06-29 MED ORDER — MIDAZOLAM HCL 5 MG/5ML IJ SOLN
INTRAMUSCULAR | Status: DC | PRN
  Administered 2017-06-29 (×2): 1 mg via INTRAVENOUS
  Administered 2017-06-29 (×3): 2 mg via INTRAVENOUS

## 2017-06-29 MED ORDER — LIDOCAINE VISCOUS 2 % MT SOLN
OROMUCOSAL | Status: AC
  Filled 2017-06-29: qty 15

## 2017-06-29 MED ORDER — LIDOCAINE VISCOUS 2 % MT SOLN
OROMUCOSAL | Status: DC | PRN
  Administered 2017-06-29: 1 via OROMUCOSAL

## 2017-06-29 NOTE — H&P (Signed)
Bridget Gay is an 76 y.o. female.   Chief Complaint: patient is here for EGD and ED. HPI: patient is 76 year old Caucasian female who presents with over a year history of heartburn. She has noted improvement with pantoprazole. She also has been expanse dysphagia primarily with meat. She points to lower cervical area at site of bolus obstruction. She feels as if she has a bubble there. She has good appetite. She denies weight loss or melena.  Past Medical History:  Diagnosis Date  . Anxiety   . Depression   . Fracture of unsp phalanx of finger, subs for fx w routn heal    from a fall this year  . Fx ankle   . GERD (gastroesophageal reflux disease) 06/05/2017    Past Surgical History:  Procedure Laterality Date  . ABDOMINAL HYSTERECTOMY    . COLONOSCOPY N/A 04/10/2013   Procedure: COLONOSCOPY;  Surgeon: Rogene Houston, MD;  Location: [facility];  Service: Endoscopy;  Laterality: N/A;  830  . TUBAL LIGATION      History reviewed. No pertinent family history. Social History:  reports that she has never smoked. She has never used smokeless tobacco. She reports that she drinks alcohol. She reports that she does not use drugs.  Allergies:  Allergies  Allergen Reactions  . Sulfa Antibiotics     unknown    Medications Prior to Admission  Medication Sig Dispense Refill  . buPROPion (WELLBUTRIN SR) 150 MG 12 hr tablet Take 150 mg by mouth 2 (two) times daily.     . cetirizine (ZYRTEC) 10 MG tablet Take 10 mg by mouth daily as needed for allergies.     . chlordiazePOXIDE (LIBRIUM) 25 MG capsule Take 25 mg by mouth as needed for anxiety.    . cycloSPORINE (RESTASIS) 0.05 % ophthalmic emulsion Place 1 drop into both eyes 2 (two) times daily.    Marland Kitchen dicyclomine (BENTYL) 10 MG capsule Take 10 mg by mouth 2 (two) times daily. MAY TAKE ADDITIONAL DOSE IF NEEDED    . latanoprost (XALATAN) 0.005 % ophthalmic solution Place 1 drop into both eyes at bedtime.     . pantoprazole (PROTONIX) 40 MG  tablet Take 40 mg by mouth daily.    . sertraline (ZOLOFT) 100 MG tablet Take 50 mg by mouth 2 (two) times daily.     . timolol (BETIMOL) 0.25 % ophthalmic solution Place 1 drop into both eyes every morning.     . loperamide (IMODIUM) 2 MG capsule Take 2 mg by mouth as needed for diarrhea or loose stools.      No results found for this or any previous visit (from the past 48 hour(s)). No results found.  ROS  Blood pressure 126/63, pulse 60, temperature 97.7 F (36.5 C), temperature source Oral, resp. rate 14, height 5\' 2"  (1.575 m), weight 146 lb (66.2 kg), SpO2 97 %. Physical Exam  Constitutional: She appears well-developed and well-nourished.  HENT:  Mouth/Throat: Oropharynx is clear and moist.  Eyes: Conjunctivae are normal. No scleral icterus.  Neck: No thyromegaly present.  Cardiovascular: Normal rate, regular rhythm and normal heart sounds.   No murmur heard. Respiratory: Effort normal and breath sounds normal.  GI: Soft. She exhibits no distension and no mass. There is tenderness (mild tenderness right mid abdomen.). There is no rebound.  Musculoskeletal: She exhibits no edema.  Lymphadenopathy:    She has no cervical adenopathy.  Neurological: She is alert.  Skin: Skin is warm and dry.  Assessment/Plan GERD of recent onset. Solid food dysphagia. EGD with ED.  Hildred Laser, MD 06/29/2017, 7:30 AM

## 2017-06-29 NOTE — Op Note (Signed)
[facility] Patient Name: Bridget Gay Procedure Date: 06/29/2017 7:10 AM MRN: [id number] Date of Birth: 04-07-1941 Attending MD: Hildred Laser , MD CSN: [id number] Age: 76 Admit Type: Outpatient Procedure:                Upper GI endoscopy Indications:              Esophageal dysphagia, Follow-up of                            gastro-esophageal reflux disease Providers:                Hildred Laser, MD, Selena Lesser, Randa Spike,                            Technician Referring MD:             Asencion Noble, MD Medicines:                Lidocaine jelly, Meperidine 50 mg IV, Midazolam 8                            mg IV Complications:            No immediate complications. Estimated Blood Loss:     Estimated blood loss: none. Procedure:                Pre-Anesthesia Assessment:                           - Prior to the procedure, a History and Physical                            was performed, and patient medications and                            allergies were reviewed. The patient's tolerance of                            previous anesthesia was also reviewed. The risks                            and benefits of the procedure and the sedation                            options and risks were discussed with the patient.                            All questions were answered, and informed consent                            was obtained. Prior Anticoagulants: The patient has                            taken no previous anticoagulant or antiplatelet                            agents. ASA Grade  Assessment: II - A patient with                            mild systemic disease. After reviewing the risks                            and benefits, the patient was deemed in                            satisfactory condition to undergo the procedure.                           After obtaining informed consent, the endoscope was                            passed under direct vision. Throughout the                             procedure, the patient's blood pressure, pulse, and                            oxygen saturations were monitored continuously. The                            EG-[id number] ([id number]) scope was introduced through the                            mouth, and advanced to the second part of duodenum.                            The upper GI endoscopy was technically difficult                            and complex due to enhanced patient gag reflex                            causing intolerance of esophageal intubation.                            Successful completion of the procedure was aided by                            withdrawing the scope and replacing with the                            'babyscope'. The patient tolerated the procedure                            well. Scope In: 7:42:08 AM Scope Out: 7:57:19 AM Total Procedure Duration: 0 hours 15 minutes 11 seconds  Findings:      The examined esophagus was normal.      The Z-line was regular and was found 33 cm from the incisors.      A 4 cm hiatal hernia was present.  The entire examined stomach was normal.      The duodenal bulb and second portion of the duodenum were normal.      No endoscopic abnormality was evident in the esophagus to explain the       patient's complaint of dysphagia. It was decided, however, to proceed       with dilation of the entire esophagus. The scope was withdrawn. Dilation       was performed with a Maloney dilator with no resistance at 50 Fr. The       dilation site was examined following endoscope reinsertion and showed no       bleeding, mucosal tear or perforation. Impression:               - Normal esophagus. Dilated.                           - Z-line regular, 33 cm from the incisors.                           - 4 cm hiatal hernia.                           - Normal stomach.                           - Normal duodenal bulb and second portion of the                             duodenum.                           - No specimens collected. Moderate Sedation:      Moderate (conscious) sedation was administered by the endoscopy nurse       and supervised by the endoscopist. The following parameters were       monitored: oxygen saturation, heart rate, blood pressure, CO2       capnography and response to care. Total physician intraservice time was       21 minutes. Recommendation:           - Patient has a contact number available for                            emergencies. The signs and symptoms of potential                            delayed complications were discussed with the                            patient. Return to normal activities tomorrow.                            Written discharge instructions were provided to the                            patient.                           -  Resume previous diet today.                           - Continue present medications.                           - Telephone GI clinic in 1 week.                           - Return to GI clinic in 6 months. Procedure Code(s):        --- Professional ---                           [phone], Esophagogastroduodenoscopy, flexible,                            transoral; diagnostic, including collection of                            specimen(s) by brushing or washing, when performed                            (separate procedure)                           43450, Dilation of esophagus, by unguided sound or                            bougie, single or multiple passes                           99152, Moderate sedation services provided by the                            same physician or other qualified health care                            professional performing the diagnostic or                            therapeutic service that the sedation supports,                            requiring the presence of an independent trained                            observer to assist in the  monitoring of the                            patient's level of consciousness and physiological                            status; initial 15 minutes of intraservice time,                            patient  age 79 years or older Diagnosis Code(s):        --- Professional ---                           K44.9, Diaphragmatic hernia without obstruction or                            gangrene                           R13.14, Dysphagia, pharyngoesophageal phase                           K21.9, Gastro-esophageal reflux disease without                            esophagitis CPT copyright 2016 American Medical Association. All rights reserved. The codes documented in this report are preliminary and upon coder review may  be revised to meet current compliance requirements. Hildred Laser, MD Hildred Laser, MD 06/29/2017 8:07:05 AM This report has been signed electronically. Number of Addenda: 0

## 2017-06-29 NOTE — Discharge Instructions (Signed)
Resume usual medications and diet. No driving for 24 hours. Please call office with progress report next week regarding difficulty. Office visit in 6 months.  (Dr Olevia Perches office will call with date and time for appointment)  Esophagogastroduodenoscopy, Care After  Refer to this sheet in the next few weeks. These instructions provide you with information about caring for yourself after your procedure. Your health care provider may also give you more specific instructions. Your treatment has been planned according to current medical practices, but problems sometimes occur. Call your health care provider if you have any problems or questions after your procedure.  Dr Laural Golden  :  [phone]  What can I expect after the procedure? After the procedure, it is common to have:  A sore throat.  Nausea.  Bloating.  Dizziness.  Fatigue.  Follow these instructions at home:  Do not eat or drink anything until the numbing medicine (local anesthetic) has worn off and your gag reflex has returned. You will know that the local anesthetic has worn off when you can swallow comfortably.  Do not drive for 24 hours if you received a medicine to help you relax (sedative).  Keep all follow-up visits as told by your health care provider. This is important. Contact a health care provider if:  You cannot stop coughing.  You are not urinating.  You are urinating less than usual. Get help right away if:  You have trouble swallowing.  You cannot eat or drink.  You have throat or chest pain that gets worse.  You are dizzy or light-headed.  You faint.  You have nausea or vomiting.  You have chills.  You have a fever.  You have severe abdominal pain.  You have black, tarry, or bloody stools.   This information is not intended to replace advice given to you by your health care provider. Make sure you discuss any questions you have with your health care provider. Document Released:  08/28/2012 Document Revised: 02/17/2016 Document Reviewed: 08/05/2015 Elsevier Interactive Patient Education  [employer].

## 2017-07-03 ENCOUNTER — Encounter (HOSPITAL_COMMUNITY): Payer: Self-pay | Admitting: Internal Medicine

## 2017-07-03 ENCOUNTER — Encounter (INDEPENDENT_AMBULATORY_CARE_PROVIDER_SITE_OTHER): Payer: Self-pay | Admitting: Internal Medicine

## 2017-11-28 ENCOUNTER — Ambulatory Visit (HOSPITAL_COMMUNITY)
Admission: RE | Admit: 2017-11-28 | Discharge: 2017-11-28 | Disposition: A | Discharge status: Home / Self Care | Payer: Medicare Other | Source: Ambulatory Visit | Attending: Internal Medicine | Admitting: Internal Medicine

## 2017-11-28 ENCOUNTER — Other Ambulatory Visit (HOSPITAL_COMMUNITY): Payer: Self-pay | Admitting: Internal Medicine

## 2017-11-28 DIAGNOSIS — M79672 Pain in left foot: Secondary | ICD-10-CM

## 2017-11-28 DIAGNOSIS — S92355A Nondisplaced fracture of fifth metatarsal bone, left foot, initial encounter for closed fracture: Secondary | ICD-10-CM | POA: Diagnosis not present

## 2017-11-28 DIAGNOSIS — X58XXXA Exposure to other specified factors, initial encounter: Secondary | ICD-10-CM | POA: Insufficient documentation

## 2017-11-29 ENCOUNTER — Telehealth: Payer: Self-pay | Admitting: Radiology

## 2017-11-29 NOTE — Telephone Encounter (Signed)
Patient injured foot in [place] has a 5th MT fracture. We will see her on Monday for this, she has a hard sole shoe and ace wrap Tylenol for pain which she will continue  She did ask if she should go into a boot she had from previous injury, I have advised her to continue ace wrap and hard sole shoe for now, unless she has severe pain which she denies.   To you FYI

## 2017-12-02 DIAGNOSIS — S92355A Nondisplaced fracture of fifth metatarsal bone, left foot, initial encounter for closed fracture: Secondary | ICD-10-CM | POA: Insufficient documentation

## 2017-12-02 NOTE — Progress Notes (Signed)
  NEW PATIENT OFFICE VISIT   Chief Complaint  Patient presents with  . Foot Injury    injured foot 11/26/17 while in [place] 5th MT fracture     77 year old female injured in [place] presents for evaluation of a left foot fracture    Review of Systems  Constitutional: Negative for chills and fever.  Skin: Positive for rash.  Neurological: Negative for sensory change and focal weakness.     Past Medical History:  Diagnosis Date  . Anxiety   . Depression   . Fracture of unsp phalanx of finger, subs for fx w routn heal    from a fall this year  . Fx ankle   . GERD (gastroesophageal reflux disease) 06/05/2017    Past Surgical History:  Procedure Laterality Date  . ABDOMINAL HYSTERECTOMY    . COLONOSCOPY N/A 04/10/2013   Procedure: COLONOSCOPY;  Surgeon: Rogene Houston, MD;  [facility]: [facility];  Service: Endoscopy;  Laterality: N/A;  830  . ESOPHAGOGASTRODUODENOSCOPY N/A 06/29/2017   Procedure: ESOPHAGOGASTRODUODENOSCOPY (EGD) WITH  ESOPHAGEAL DILATION;  Surgeon: Rogene Houston, MD;  [facility]: [facility];  Service: Endoscopy;  Laterality: N/A;  1:00  . TUBAL LIGATION      No family history on file. Social History   Tobacco Use  . Smoking status: Never Smoker  . Smokeless tobacco: Never Used  Substance Use Topics  . Alcohol use: Yes    Alcohol/week: 0.0 oz    Comment: occasionally  . Drug use: No    Allergies  Allergen Reactions  . Sulfa Antibiotics     unknown     No outpatient medications have been marked as taking for the 12/03/17 encounter (Appointment) with Carole Civil, MD.    BP 130/79   Pulse 76   Ht 5' 2.5" (1.588 m)   Wt 146 lb (66.2 kg)   BMI 26.28 kg/m   Physical Exam  Constitutional: She is oriented to person, place, and time. She appears well-developed and well-nourished.  Musculoskeletal:       Feet:  Neurological: She is alert and oriented to person, place, and time.  Psychiatric: She has a normal mood and  affect. Judgment normal.  Vitals reviewed.   Ortho Exam  MEDICAL DECISION SECTION  xrays ordered?  No  My independent reading of xrays: We see that she has had x-rays that shows a nondisplaced fifth metatarsal fracture in the avulsion variety    Encounter Diagnosis  Name Primary?  . Closed nondisplaced fracture of fifth metatarsal bone of left foot, initial encounter Yes     PLAN:   Hard sole shoe x 5 weeks

## 2017-12-03 ENCOUNTER — Ambulatory Visit: Payer: Medicare Other | Admitting: Orthopedic Surgery

## 2017-12-03 ENCOUNTER — Encounter: Payer: Self-pay | Admitting: Orthopedic Surgery

## 2017-12-03 VITALS — BP 130/79 | HR 76 | Ht 62.5 in | Wt 146.0 lb

## 2017-12-03 DIAGNOSIS — S92355A Nondisplaced fracture of fifth metatarsal bone, left foot, initial encounter for closed fracture: Secondary | ICD-10-CM | POA: Diagnosis not present

## 2018-01-01 ENCOUNTER — Ambulatory Visit (INDEPENDENT_AMBULATORY_CARE_PROVIDER_SITE_OTHER): Payer: Medicare Other | Admitting: Internal Medicine

## 2018-01-07 ENCOUNTER — Encounter: Payer: Self-pay | Admitting: Orthopedic Surgery

## 2018-01-07 ENCOUNTER — Ambulatory Visit (INDEPENDENT_AMBULATORY_CARE_PROVIDER_SITE_OTHER): Payer: Self-pay | Admitting: Orthopedic Surgery

## 2018-01-07 ENCOUNTER — Ambulatory Visit (INDEPENDENT_AMBULATORY_CARE_PROVIDER_SITE_OTHER): Payer: Medicare Other

## 2018-01-07 VITALS — BP 116/75 | HR 72 | Ht 62.5 in | Wt 146.0 lb

## 2018-01-07 DIAGNOSIS — S92355D Nondisplaced fracture of fifth metatarsal bone, left foot, subsequent encounter for fracture with routine healing: Secondary | ICD-10-CM | POA: Diagnosis not present

## 2018-01-07 NOTE — Progress Notes (Signed)
Fracture care follow-up  Chief Complaint  Patient presents with  . Foot Injury    fracture 5th MT 11/26/17 no complaints of pain, wants to d/c hard sole shoe    Encounter Diagnosis  Name Primary?  . Closed nondisplaced fracture of fifth metatarsal bone of left foot with routine healing, subsequent encounter 11/26/17 Yes    She completed her trip to [place] with no problems.  She has no complaints.  She does have a history of osteopenia but is on calcium and vitamin D.  She had a recent bone density test which showed the osteopenia  She does do some weightbearing exercise I encouraged her to do more    Current Outpatient Medications:  .  buPROPion (WELLBUTRIN SR) 150 MG 12 hr tablet, Take 150 mg by mouth 2 (two) times daily. , Disp: , Rfl:  .  cetirizine (ZYRTEC) 10 MG tablet, Take 10 mg by mouth daily as needed for allergies. , Disp: , Rfl:  .  chlordiazePOXIDE (LIBRIUM) 25 MG capsule, Take 25 mg by mouth as needed for anxiety., Disp: , Rfl:  .  cycloSPORINE (RESTASIS) 0.05 % ophthalmic emulsion, Place 1 drop into both eyes 2 (two) times daily., Disp: , Rfl:  .  Diclofenac Sodium 3 % GEL, Place onto the skin., Disp: , Rfl:  .  dicyclomine (BENTYL) 10 MG capsule, Take 10 mg by mouth 2 (two) times daily. MAY TAKE ADDITIONAL DOSE IF NEEDED, Disp: , Rfl:  .  HYDROcodone-acetaminophen (NORCO/VICODIN) 5-325 MG tablet, , Disp: , Rfl:  .  latanoprost (XALATAN) 0.005 % ophthalmic solution, Place 1 drop into both eyes at bedtime. , Disp: , Rfl:  .  loperamide (IMODIUM) 2 MG capsule, Take 2 mg by mouth as needed for diarrhea or loose stools., Disp: , Rfl:  .  pantoprazole (PROTONIX) 40 MG tablet, Take 40 mg by mouth daily., Disp: , Rfl:  .  sertraline (ZOLOFT) 100 MG tablet, Take 50 mg by mouth 2 (two) times daily. , Disp: , Rfl:  .  timolol (BETIMOL) 0.25 % ophthalmic solution, Place 1 drop into both eyes every morning. , Disp: , Rfl:   BP 116/75   Pulse 72   Ht 5' 2.5" (1.588 m)   Wt 146 lb  (66.2 kg)   BMI 26.28 kg/m   Physical Exam Normal alignment in the left foot no tenderness  Xrays: X-rays ordered and read as a fibrous union of the fifth metatarsal fracture of the left foot  Plan  Patient is released to normal activities follow-up as needed

## 2018-01-22 ENCOUNTER — Encounter (INDEPENDENT_AMBULATORY_CARE_PROVIDER_SITE_OTHER): Payer: Self-pay | Admitting: Internal Medicine

## 2018-01-22 ENCOUNTER — Ambulatory Visit (INDEPENDENT_AMBULATORY_CARE_PROVIDER_SITE_OTHER): Payer: Medicare Other | Admitting: Internal Medicine

## 2018-01-22 VITALS — BP 118/80 | HR 68 | Temp 97.7°F | Resp 18 | Ht 62.0 in | Wt 148.4 lb

## 2018-01-22 DIAGNOSIS — K219 Gastro-esophageal reflux disease without esophagitis: Secondary | ICD-10-CM

## 2018-01-22 DIAGNOSIS — K580 Irritable bowel syndrome with diarrhea: Secondary | ICD-10-CM | POA: Diagnosis not present

## 2018-01-22 MED ORDER — RANITIDINE HCL 150 MG PO TABS: 150.0000 mg | ORAL_TABLET | Freq: Two times a day (BID) | ORAL | Status: DC

## 2018-01-22 NOTE — Patient Instructions (Addendum)
Take pantoprazole 40 mg every other day for 2 weeks and stop. Zantac or ranitidine 150 mg by mouth twice daily. Take dicyclomine 10 mg and loperamide or Imodium 2 mg every morning. Keep stool diary as to frequency and consistency of stools for 2 weeks and bring diary to office. Can take Tums on as-needed basis while on Zantac or ranitidine.

## 2018-01-22 NOTE — Progress Notes (Signed)
Presenting complaint;  Follow-up for GERD and IBS.  Database and subjective:  Patient is 77 year old Caucasian female who was last seen in September 2018 for over a year history of heartburn responding partially to pantoprazole.  She was also complaining of solid food dysphagia.  She underwent EGD on 06/29/2017 revealing small sliding hiatal hernia.  She did not have stricturing or erosive esophagitis.  Her esophagus was dilated by passing Maloney dilator.  Now she comes in with 2 complaints.  She has heartburn at least 2 times a week.  She also hears gurgling in her chest.  The symptoms is not associated with pain nausea or vomiting.  She made her gurgling couple of times a week.  With she says she is getting better control of heartburn with the ranitidine and would like to try that medication in place of pantoprazole.  She also complains of intermittent diarrhea with urgency.  She has regular bowel movements 2 to 3 days a week.  Other days her stool is liquid.  She also has a occasional nocturnal diarrhea and she has had 1-2 episodes per month.  She also has urgency.  She has tried Bentyl but it makes her sleepy.  She denies abdominal pain melena or rectal bleeding.  Her appetite is good and her weight has been stable. Her last colonoscopy in May 2014 revealed sigmoid diverticulosis and external hemorrhoids and anal papillae.   Current Medications: Outpatient Encounter Medications as of 01/22/2018  Medication Sig  . buPROPion (WELLBUTRIN SR) 150 MG 12 hr tablet Take 150 mg by mouth 2 (two) times daily.   . cetirizine (ZYRTEC) 10 MG tablet Take 10 mg by mouth daily as needed for allergies.   . chlordiazePOXIDE (LIBRIUM) 25 MG capsule Take 25 mg by mouth as needed for anxiety.  . Diclofenac Sodium 3 % GEL Place onto the skin.  Marland Kitchen dicyclomine (BENTYL) 10 MG capsule Take 10 mg by mouth 2 (two) times daily. MAY TAKE ADDITIONAL DOSE IF NEEDED  . HYDROcodone-acetaminophen (NORCO/VICODIN) 5-325 MG tablet  Take 1 tablet by mouth. Patient states that she takes 1/2tablet rarely.  . latanoprost (XALATAN) 0.005 % ophthalmic solution Place 1 drop into both eyes at bedtime.   Marland Kitchen loperamide (IMODIUM) 2 MG capsule Take 2 mg by mouth as needed for diarrhea or loose stools.  . timolol (BETIMOL) 0.25 % ophthalmic solution Place 1 drop into both eyes every morning.   . pantoprazole (PROTONIX) 40 MG tablet Take 40 mg by mouth daily.  . [DISCONTINUED] cycloSPORINE (RESTASIS) 0.05 % ophthalmic emulsion Place 1 drop into both eyes 2 (two) times daily.  . [DISCONTINUED] sertraline (ZOLOFT) 100 MG tablet Take 50 mg by mouth 2 (two) times daily.    No facility-administered encounter medications on file as of 01/22/2018.     Objective: Blood pressure 118/80, pulse 68, temperature 97.7 F (36.5 C), temperature source Oral, resp. rate 18, height 5\' 2"  (1.575 m), weight 148 lb 6.4 oz (67.3 kg). Patient is alert and in no acute distress. Conjunctiva is pink. Sclera is nonicteric Oropharyngeal mucosa is normal. No neck masses or thyromegaly noted. Cardiac exam with regular rhythm normal S1 and S2. No murmur or gallop noted. Lungs are clear to auscultation. Abdomen is full.  Bowel sounds are normal.  On palpation is soft and nontender with organomegaly or masses. No LE edema or clubbing noted.   Assessment:  #1.  GERD.  She feels she is getting better symptom control with ranitidine.  Therefore she will be transition to  ranitidine.  She did not have evidence of erosive esophagitis on EGD of October 2018.  #2.  Irritable bowel syndrome.  If she does not respond to antidiarrheal and antispasmodic may consider 2-week course of Xifaxan.   Plan:  Change pantoprazole to 40 mg every other day for 2 weeks and stop. Ranitidine 150 mg p.o. twice daily. Tums as needed for breakthrough symptoms. Take dicyclomine 10 mg and loperamide 2 mg every morning. Keep stool diary as to frequency and consistency for 2 weeks and send  [place] a summary. Office visit on as needed basis.

## 2018-03-11 ENCOUNTER — Telehealth (INDEPENDENT_AMBULATORY_CARE_PROVIDER_SITE_OTHER): Payer: Self-pay | Admitting: Internal Medicine

## 2018-03-11 NOTE — Telephone Encounter (Signed)
Patient provided stool diary which I have reviewed with her the phone.  She feels her bowel movements are getting better.  She had regular bowel movements for the last 3 days of the diary that she kept. She feels coughing has gotten worse since he switch from pantoprazole to Zantac. He will go back to taking pantoprazole 40 mg every morning and take Zantac 150 mg at bedtime. Patient will call with progress report in 1 month.

## 2018-03-29 ENCOUNTER — Encounter (INDEPENDENT_AMBULATORY_CARE_PROVIDER_SITE_OTHER): Payer: Self-pay | Admitting: *Deleted

## 2019-03-01 ENCOUNTER — Other Ambulatory Visit (INDEPENDENT_AMBULATORY_CARE_PROVIDER_SITE_OTHER): Payer: Self-pay | Admitting: Internal Medicine

## 2019-12-02 DIAGNOSIS — K219 Gastro-esophageal reflux disease without esophagitis: Secondary | ICD-10-CM | POA: Diagnosis not present

## 2019-12-02 DIAGNOSIS — Z79899 Other long term (current) drug therapy: Secondary | ICD-10-CM | POA: Diagnosis not present

## 2019-12-02 DIAGNOSIS — F329 Major depressive disorder, single episode, unspecified: Secondary | ICD-10-CM | POA: Diagnosis not present

## 2019-12-02 DIAGNOSIS — N183 Chronic kidney disease, stage 3 unspecified: Secondary | ICD-10-CM | POA: Diagnosis not present

## 2019-12-08 DIAGNOSIS — R000 Tachycardia, unspecified: Secondary | ICD-10-CM | POA: Diagnosis not present

## 2019-12-08 DIAGNOSIS — N1832 Chronic kidney disease, stage 3b: Secondary | ICD-10-CM | POA: Diagnosis not present

## 2019-12-08 DIAGNOSIS — F330 Major depressive disorder, recurrent, mild: Secondary | ICD-10-CM | POA: Diagnosis not present

## 2019-12-08 DIAGNOSIS — R296 Repeated falls: Secondary | ICD-10-CM | POA: Diagnosis not present

## 2019-12-10 ENCOUNTER — Other Ambulatory Visit (HOSPITAL_COMMUNITY): Payer: Self-pay | Admitting: Internal Medicine

## 2019-12-10 ENCOUNTER — Other Ambulatory Visit: Payer: Self-pay | Admitting: Internal Medicine

## 2019-12-10 DIAGNOSIS — G44301 Post-traumatic headache, unspecified, intractable: Secondary | ICD-10-CM

## 2019-12-10 DIAGNOSIS — R748 Abnormal levels of other serum enzymes: Secondary | ICD-10-CM

## 2019-12-10 DIAGNOSIS — R2689 Other abnormalities of gait and mobility: Secondary | ICD-10-CM

## 2019-12-24 ENCOUNTER — Other Ambulatory Visit: Payer: Self-pay

## 2019-12-24 ENCOUNTER — Encounter: Payer: Self-pay | Admitting: Cardiology

## 2019-12-24 ENCOUNTER — Ambulatory Visit (INDEPENDENT_AMBULATORY_CARE_PROVIDER_SITE_OTHER): Payer: Medicare PPO | Admitting: Cardiology

## 2019-12-24 VITALS — BP 118/68 | HR 65 | Ht 63.0 in | Wt 152.0 lb

## 2019-12-24 DIAGNOSIS — R002 Palpitations: Secondary | ICD-10-CM | POA: Diagnosis not present

## 2019-12-24 NOTE — Progress Notes (Signed)
-+    Clinical Summary Bridget Gay is a 79 y.o.female seen as new consult, referred by Dr Willey Blade for palpitations.   1. Tachycardia/palpitations - some recent elevated HRs x 1 year - feeling of heart racing, can get diaphoretic. Can get dizzy - most commonly occurs with exertion. Lasts a few minutes. Episodes about 2-3 times a week.  - walks 3 miles regularly without troubles.   - half caf x 2 cups, rare tea, no sodas, no energy drinks, wine 5 nights a week   Past Medical History:  Diagnosis Date  . Anxiety   . Depression   . Fracture of unsp phalanx of finger, subs for fx w routn heal    from a fall this year  . Fx ankle   . GERD (gastroesophageal reflux disease) 06/05/2017     Allergies  Allergen Reactions  . Sulfa Antibiotics     unknown     Current Outpatient Medications  Medication Sig Dispense Refill  . buPROPion (WELLBUTRIN SR) 150 MG 12 hr tablet Take 150 mg by mouth 2 (two) times daily.     . cetirizine (ZYRTEC) 10 MG tablet Take 10 mg by mouth daily as needed for allergies.     . chlordiazePOXIDE (LIBRIUM) 25 MG capsule Take 25 mg by mouth as needed for anxiety.    . Diclofenac Sodium 3 % GEL Place onto the skin.    Marland Kitchen dicyclomine (BENTYL) 10 MG capsule Take 10 mg by mouth 2 (two) times daily. MAY TAKE ADDITIONAL DOSE IF NEEDED    . HYDROcodone-acetaminophen (NORCO/VICODIN) 5-325 MG tablet Take 1 tablet by mouth. Patient states that she takes 1/2tablet rarely.    . latanoprost (XALATAN) 0.005 % ophthalmic solution Place 1 drop into both eyes at bedtime.     Marland Kitchen loperamide (IMODIUM) 2 MG capsule TAKE ONE CAPSULE EACH MORNING 30 capsule 0  . pantoprazole (PROTONIX) 40 MG tablet Take 1 tablet (40 mg total) by mouth.    . ranitidine (ZANTAC) 150 MG tablet Take 1 tablet (150 mg total) by mouth 2 (two) times daily.    . timolol (BETIMOL) 0.25 % ophthalmic solution Place 1 drop into both eyes every morning.      No current facility-administered medications for this visit.       Past Surgical History:  Procedure Laterality Date  . ABDOMINAL HYSTERECTOMY    . COLONOSCOPY N/A 04/10/2013   Procedure: COLONOSCOPY;  Surgeon: Rogene Houston, MD;  [facility];  Service: Endoscopy;  Laterality: N/A;  830  . ESOPHAGOGASTRODUODENOSCOPY N/A 06/29/2017   Procedure: ESOPHAGOGASTRODUODENOSCOPY (EGD) WITH  ESOPHAGEAL DILATION;  Surgeon: Rogene Houston, MD;  [facility];  Service: Endoscopy;  Laterality: N/A;  1:00  . TUBAL LIGATION       Allergies  Allergen Reactions  . Sulfa Antibiotics     unknown      No family history on file.   Social History Ms. Reutov reports that she has never smoked. She has never used smokeless tobacco. Ms. Lamarre reports current alcohol use.   Review of Systems CONSTITUTIONAL: No weight loss, fever, chills, weakness or fatigue.  HEENT: Eyes: No visual loss, blurred vision, double vision or yellow sclerae.No hearing loss, sneezing, congestion, runny nose or sore throat.  SKIN: No rash or itching.  CARDIOVASCULAR: per hpi RESPIRATORY: No shortness of breath, cough or sputum.  GASTROINTESTINAL: No anorexia, nausea, vomiting or diarrhea. No abdominal pain or blood.  GENITOURINARY: No burning on urination, no polyuria NEUROLOGICAL: No  headache, dizziness, syncope, paralysis, ataxia, numbness or tingling in the extremities. No change in bowel or bladder control.  MUSCULOSKELETAL: No muscle, back pain, joint pain or stiffness.  LYMPHATICS: No enlarged nodes. No history of splenectomy.  PSYCHIATRIC: No history of depression or anxiety.  ENDOCRINOLOGIC: No reports of sweating, cold or heat intolerance. No polyuria or polydipsia.  Marland Kitchen   Physical Examination Today's Vitals   12/24/19 0852  BP: 118/68  Pulse: 65  SpO2: 97%  Weight: 152 lb (68.9 kg)  Height: 5\' 3"  (1.6 m)   Body mass index is 26.93 kg/m.  Gen: resting comfortably, no acute distress HEENT: no scleral icterus, pupils equal round and reactive,  no palptable cervical adenopathy,  CV: RRR, no m/r/g, no jvd Resp: Clear to auscultation bilaterally GI: abdomen is soft, non-tender, non-distended, normal bowel sounds, no hepatosplenomegaly MSK: extremities are warm, no edema.  Skin: warm, no rash Neuro:  no focal deficits Psych: appropriate affect     Assessment and Plan  1. Palpitations - baselien EKG shows mild sinus brady, rare PVCs - we will plan on a 7 day monitor to further evaluate.    F/u 1 month virtual vist      Arnoldo Lenis, M.D.

## 2019-12-24 NOTE — Patient Instructions (Signed)
Your physician recommends that you schedule a follow-up appointment in: [place] recommends that you continue on your current medications as directed. Please refer to the Current Medication list given to you today.  Your physician has recommended that you wear an event monitor FOR 7 DAYS. Event monitors are medical devices that record the heart's electrical activity. Doctors most often [place] these monitors to diagnose arrhythmias. Arrhythmias are problems with the speed or rhythm of the heartbeat. The monitor is a small, portable device. You can wear one while you do your normal daily activities. This is usually used to diagnose what is causing palpitations/syncope (passing out).  Thank you for choosing [facility]!!

## 2019-12-31 ENCOUNTER — Encounter (INDEPENDENT_AMBULATORY_CARE_PROVIDER_SITE_OTHER): Payer: Medicare PPO

## 2019-12-31 DIAGNOSIS — R002 Palpitations: Secondary | ICD-10-CM

## 2020-01-16 ENCOUNTER — Ambulatory Visit (HOSPITAL_COMMUNITY)
Admission: RE | Admit: 2020-01-16 | Discharge: 2020-01-16 | Disposition: A | Discharge status: Home / Self Care | Payer: Medicare PPO | Source: Ambulatory Visit | Attending: Internal Medicine | Admitting: Internal Medicine

## 2020-01-16 ENCOUNTER — Other Ambulatory Visit: Payer: Self-pay

## 2020-01-16 DIAGNOSIS — R748 Abnormal levels of other serum enzymes: Secondary | ICD-10-CM | POA: Insufficient documentation

## 2020-01-16 DIAGNOSIS — G44301 Post-traumatic headache, unspecified, intractable: Secondary | ICD-10-CM

## 2020-01-16 DIAGNOSIS — R2689 Other abnormalities of gait and mobility: Secondary | ICD-10-CM

## 2020-01-16 DIAGNOSIS — R7989 Other specified abnormal findings of blood chemistry: Secondary | ICD-10-CM | POA: Diagnosis not present

## 2020-01-16 DIAGNOSIS — G44309 Post-traumatic headache, unspecified, not intractable: Secondary | ICD-10-CM | POA: Diagnosis not present

## 2020-01-20 DIAGNOSIS — H401121 Primary open-angle glaucoma, left eye, mild stage: Secondary | ICD-10-CM | POA: Diagnosis not present

## 2020-01-20 DIAGNOSIS — H04123 Dry eye syndrome of bilateral lacrimal glands: Secondary | ICD-10-CM | POA: Diagnosis not present

## 2020-01-20 DIAGNOSIS — H401112 Primary open-angle glaucoma, right eye, moderate stage: Secondary | ICD-10-CM | POA: Diagnosis not present

## 2020-01-21 ENCOUNTER — Telehealth (INDEPENDENT_AMBULATORY_CARE_PROVIDER_SITE_OTHER): Payer: Medicare PPO | Admitting: Cardiology

## 2020-01-21 ENCOUNTER — Encounter: Payer: Self-pay | Admitting: Cardiology

## 2020-01-21 VITALS — BP 116/68 | Ht 63.0 in | Wt 150.0 lb

## 2020-01-21 DIAGNOSIS — R002 Palpitations: Secondary | ICD-10-CM

## 2020-01-21 DIAGNOSIS — R0789 Other chest pain: Secondary | ICD-10-CM

## 2020-01-21 NOTE — Patient Instructions (Signed)

## 2020-01-21 NOTE — Progress Notes (Signed)
Clinical Summary Bridget Gay is a 79 y.o.female seen today for follow up of the following medical problems.    1. Tachycardia/palpitations - some recent elevated HRs x 1 year - feeling of heart racing, can get diaphoretic. Can get dizzy - most commonly occurs with exertion. Lasts a few minutes. Episodes about 2-3 times a week.  - walks 3 miles regularly without troubles.   - half caf x 2 cups, rare tea, no sodas, no energy drinks, wine 5 nights a week   12/2019 heart monitor showed SR, rare PVCs. No arrhythmias.  - mild infrequent symptoms since last visit  2. Chest pain - can occur midchest, pressure. 3/10 in severity. Occasional diaphoresis, some SOB - often occurs with getting up in the morning. No association with meals. After statring antacid symptoms improved - lasts just a few minutes - not positional  - walks 3 miles several times a week. No exertional symptoms  Past Medical History:  Diagnosis Date  . Anxiety   . Depression   . Fracture of unsp phalanx of finger, subs for fx w routn heal    from a fall this year  . Fx ankle   . GERD (gastroesophageal reflux disease) 06/05/2017     Allergies  Allergen Reactions  . Sulfa Antibiotics     unknown     Current Outpatient Medications  Medication Sig Dispense Refill  . cetirizine (ZYRTEC) 10 MG tablet Take 10 mg by mouth daily as needed for allergies.     . chlordiazePOXIDE (LIBRIUM) 25 MG capsule Take 25 mg by mouth as needed for anxiety.    . dicyclomine (BENTYL) 10 MG capsule Take 10 mg by mouth 2 (two) times daily. MAY TAKE ADDITIONAL DOSE IF NEEDED    . DULoxetine (CYMBALTA) 60 MG capsule Take 60 mg by mouth daily.    Marland Kitchen HYDROcodone-acetaminophen (NORCO/VICODIN) 5-325 MG tablet Take 1 tablet by mouth. Patient states that she takes 1/2tablet rarely.    . latanoprost (XALATAN) 0.005 % ophthalmic solution Place 1 drop into both eyes at bedtime.     . nitrofurantoin (MACRODANTIN) 100 MG capsule Take 100 mg by  mouth 2 (two) times daily.    Marland Kitchen omeprazole (PRILOSEC) 20 MG capsule Take 20 mg by mouth 2 (two) times daily before a meal.    . timolol (BETIMOL) 0.25 % ophthalmic solution Place 1 drop into both eyes every morning.      No current facility-administered medications for this visit.     Past Surgical History:  Procedure Laterality Date  . ABDOMINAL HYSTERECTOMY    . COLONOSCOPY N/A 04/10/2013   Procedure: COLONOSCOPY;  Surgeon: Rogene Houston, MD;  [facility];  Service: Endoscopy;  Laterality: N/A;  830  . ESOPHAGOGASTRODUODENOSCOPY N/A 06/29/2017   Procedure: ESOPHAGOGASTRODUODENOSCOPY (EGD) WITH  ESOPHAGEAL DILATION;  Surgeon: Rogene Houston, MD;  [facility];  Service: Endoscopy;  Laterality: N/A;  1:00  . TUBAL LIGATION       Allergies  Allergen Reactions  . Sulfa Antibiotics     unknown      No family history on file.   Social History Bridget Gay reports that she has never smoked. She has never used smokeless tobacco. Bridget Gay reports current alcohol use.   Review of Systems CONSTITUTIONAL: No weight loss, fever, chills, weakness or fatigue.  HEENT: Eyes: No visual loss, blurred vision, double vision or yellow sclerae.No hearing loss, sneezing, congestion, runny nose or sore throat.  SKIN: No  rash or itching.  CARDIOVASCULAR: per hpi RESPIRATORY: No shortness of breath, cough or sputum.  GASTROINTESTINAL: No anorexia, nausea, vomiting or diarrhea. No abdominal pain or blood.  GENITOURINARY: No burning on urination, no polyuria NEUROLOGICAL: No headache, dizziness, syncope, paralysis, ataxia, numbness or tingling in the extremities. No change in bowel or bladder control.  MUSCULOSKELETAL: No muscle, back pain, joint pain or stiffness.  LYMPHATICS: No enlarged nodes. No history of splenectomy.  PSYCHIATRIC: No history of depression or anxiety.  ENDOCRINOLOGIC: No reports of sweating, cold or heat intolerance. No polyuria or polydipsia.   Marland Kitchen   Physical Examination Today's Vitals   01/21/20 1252  BP: 116/68  Weight: 150 lb (68 kg)  Height: 5\' 3"  (1.6 m)   Body mass index is 26.57 kg/m.  Gen: resting comfortably, no acute distress HEENT: no scleral icterus, pupils equal round and reactive, no palptable cervical adenopathy,  CV: RRR, no m/r/g, no jvd Resp: Clear to auscultation bilaterally GI: abdomen is soft, non-tender, non-distended, normal bowel sounds, no hepatosplenomegaly MSK: extremities are warm, no edema.  Skin: warm, no rash Neuro:  no focal deficits Psych: appropriate affect   Diagnostic Studies     Assessment and Plan   1. Palpitations - overall benign cardiac event monitor, just rare PVCs - mild infrequent symptoms since last visit, just monitor at this time - if progression could consdier low dose CCB or beta blocker   2. Chest pain - not consistent with ischemia - walks 3 miles regularly wtihout exertional symptoms. Had some improvement with antacid previously - monitor at this time.    F/u 1 year  Arnoldo Lenis, M.D.

## 2020-03-17 DIAGNOSIS — F321 Major depressive disorder, single episode, moderate: Secondary | ICD-10-CM | POA: Diagnosis not present

## 2020-03-17 DIAGNOSIS — F419 Anxiety disorder, unspecified: Secondary | ICD-10-CM | POA: Diagnosis not present

## 2020-03-17 DIAGNOSIS — N183 Chronic kidney disease, stage 3 unspecified: Secondary | ICD-10-CM | POA: Diagnosis not present

## 2020-03-17 DIAGNOSIS — E785 Hyperlipidemia, unspecified: Secondary | ICD-10-CM | POA: Diagnosis not present

## 2020-03-17 DIAGNOSIS — Z79899 Other long term (current) drug therapy: Secondary | ICD-10-CM | POA: Diagnosis not present

## 2020-03-17 DIAGNOSIS — R000 Tachycardia, unspecified: Secondary | ICD-10-CM | POA: Diagnosis not present

## 2020-03-22 DIAGNOSIS — F339 Major depressive disorder, recurrent, unspecified: Secondary | ICD-10-CM | POA: Diagnosis not present

## 2020-04-05 ENCOUNTER — Encounter: Payer: Self-pay | Admitting: Neurology

## 2020-04-05 ENCOUNTER — Ambulatory Visit: Payer: Medicare PPO | Admitting: Neurology

## 2020-04-05 ENCOUNTER — Other Ambulatory Visit: Payer: Self-pay

## 2020-04-05 VITALS — BP 119/78 | HR 65 | Ht 62.0 in | Wt 154.0 lb

## 2020-04-05 DIAGNOSIS — I6381 Other cerebral infarction due to occlusion or stenosis of small artery: Secondary | ICD-10-CM | POA: Diagnosis not present

## 2020-04-05 DIAGNOSIS — R4189 Other symptoms and signs involving cognitive functions and awareness: Secondary | ICD-10-CM | POA: Diagnosis not present

## 2020-04-05 NOTE — Progress Notes (Signed)
[id number]    Provider:  Dr Jaynee Eagles Requesting Provider: Asencion Noble, MD Primary Care Provider:  Asencion Noble, MD  CC:  Memory disturbance  HPI:  Bridget Gay is a 79 y.o. female here as requested by Asencion Noble, MD for memory disturbance. PMHx migraine, chronic kidney disease stage III, hyperlipidemia, glaucoma, GERD, depression, anxiety, recent lacunar stroke.  I reviewed Dr. Ria Comment notes: She was found to have a right frontal infarct and MRI of the brain, she was started on aspirin and atorvastatin, cholesterol 161 with an HDL of 52 and an LDL of 85, she has some difficulty with dropping things, she feels that her memory has not been as good, prior TSH, B12 was normal, she has stable chronic kidney disease with a creatinine 1.15, previous level was 1.13 GFR is 46, ultrasound revealed no hydronephrosis, she is not hypertensive, she is not using NSAIDs, depression symptoms have responded favorably to Cymbalta 60 mg daily, she states she feels better in this regard and would like to continue the dose, not walking as much because of her husband's current health status.  Neurology referral and possibly neuropsychological testing was discussed in the past with her.  Patient reports memory disturbance started 2 years ago, she feels as though it has gotten worse, mostly the names of people, places or things.  She can come up with a scenario but she cannot get the name of it. She is "terrified" there is an oncoming dementia, she has a hard time remembering the names of things, it could be people, places or things, it will come back to her. Her husband is here and provides much information. She has no known dementia history in the family but there is a history depression on her mother's side. She misplaces things, she has to look for items maybe it is carelessness and she does have some mind racing, she has always been easily distracted, ongoing for years she would be easily distractable. She  has to ask her husband questions, more like "remember who said this..." or "who did we see yesterday", she says she is even asking about things that happened yesterday, no accidents in the car, she has had a few instances of having to think where is she going but she does not get lost. She manages her own medications and doesn't forget, she can even tell me how many pills she takes every day. Husband is not concerned, he doesn't think she has any problems, she is just highly intelligent and she tries to multi-task and her mind "flips". "I am not as sharp as I used to be". She does have a HS best friend with dementia and this scares her. She has a lot of anxiety.  No other focal neurologic deficits, associated symptoms, inciting events or modifiable factors.  Reviewed notes, labs and imaging from outside physicians, which showed:  I reviewed labs which were collected March 17, 2020: BUN 22, creatinine 1.15, GFR 46, otherwise CMP unremarkable, LDL 85 (discussed goal is less than 70).  I reviewed MRI images January 16, 2020, I agree with the following findings which showed a punctate acute infarct in the right frontal subcortical white matter, chronic small vessel ischemic changes affecting the pons and cerebellum which are moderate, no cortical or large vessel territory infarction, no mass lesion, hemorrhage, hydrocephalus or extra-axial collection,:  Background pattern of moderate chronic small vessel disease of the pons and cerebral hemispheric white matter. Punctate acute subcortical white matter infarction in the right frontal  lobe. One wonders if this is incidentally detected as part of the patient's small-vessel disease. There does not appear to be any hemorrhage related to that and it is such a small lesion in a non eloquent part of the brain that it might well be subclinical. No clear post traumatic finding and no abnormality that would be expected to cause headache.  Review of Systems: Patient  complains of symptoms per HPI as well as the following symptoms: cognitive changes, stroke. Pertinent negatives and positives per HPI. All others negative.   Social History   Socioeconomic History  . Marital status: Married    Spouse name: Not on file  . Number of children: Not on file  . Years of education: Not on file  . Highest education level: Master's degree (e.g., MA, MS, MEng, MEd, MSW, MBA)  Occupational History  . Not on file  Tobacco Use  . Smoking status: Never Smoker  . Smokeless tobacco: Never Used  Vaping Use  . Vaping Use: Never used  Substance and Sexual Activity  . Alcohol use: Yes    Alcohol/week: 0.0 standard drinks    Comment: occasionally  . Drug use: No  . Sexual activity: Not on file  Other Topics Concern  . Not on file  Social History Narrative   Lives at home    Right handed   Caffeine: 2 cups coffee in the morning and maybe a tea at lunchtime   Social Determinants of Health   Financial Resource Strain:   . Difficulty of Paying Living Expenses:   Food Insecurity:   . Worried About Charity fundraiser in the Last Year:   . Arboriculturist in the Last Year:   Transportation Needs:   . Film/video editor (Medical):   Marland Kitchen Lack of Transportation (Non-Medical):   Physical Activity:   . Days of Exercise per Week:   . Minutes of Exercise per Session:   Stress:   . Feeling of Stress :   Social Connections:   . Frequency of Communication with Friends and Family:   . Frequency of Social Gatherings with Friends and Family:   . Attends Religious Services:   . Active Member of Clubs or Organizations:   . Attends Archivist Meetings:   Marland Kitchen Marital Status:   Intimate Partner Violence:   . Fear of Current or Ex-Partner:   . Emotionally Abused:   Marland Kitchen Physically Abused:   . Sexually Abused:     Family History  Problem Relation Age of Onset  . Depression Mother        wasn't diagnosed   . Alzheimer's disease Neg Hx   . Dementia Neg Hx      Past Medical History:  Diagnosis Date  . Anxiety   . Depression   . Disorder of cartilage, unspecified   . Fracture of unsp phalanx of finger, subs for fx w routn heal    from a fall this year  . Fx ankle   . GERD (gastroesophageal reflux disease) 06/05/2017  . Glaucoma   . Hyperlipidemia   . Migraine   . Postmenopausal syndrome     Patient Active Problem List   Diagnosis Date Noted  . Lacunar stroke ([place]) 04/05/2020  . Subjective cognitive impairment 04/05/2020  . Closed nondisplaced fracture of fifth left metatarsal bone 11/26/17 12/02/2017  . GERD (gastroesophageal reflux disease) 06/05/2017  . Gastroesophageal reflux disease without esophagitis 06/05/2017    Past Surgical History:  Procedure Laterality Date  .  ABDOMINAL HYSTERECTOMY    . COLONOSCOPY N/A 04/10/2013   Procedure: COLONOSCOPY;  Surgeon: Rogene Houston, MD;  [facility];  Service: Endoscopy;  Laterality: N/A;  830  . ESOPHAGOGASTRODUODENOSCOPY N/A 06/29/2017   Procedure: ESOPHAGOGASTRODUODENOSCOPY (EGD) WITH  ESOPHAGEAL DILATION;  Surgeon: Rogene Houston, MD;  [facility];  Service: Endoscopy;  Laterality: N/A;  1:00  . TONSILLECTOMY    . TUBAL LIGATION      Current Outpatient Medications  Medication Sig Dispense Refill  . atorvastatin (LIPITOR) 20 MG tablet Take 20 mg by mouth at bedtime.    . betamethasone dipropionate 0.05 % cream Apply topically 2 (two) times daily.    . cetirizine (ZYRTEC) 10 MG tablet Take 10 mg by mouth daily as needed for allergies.     . chlordiazePOXIDE (LIBRIUM) 25 MG capsule Take 25 mg by mouth in the morning and at bedtime. As needed.    . dicyclomine (BENTYL) 10 MG capsule Take 10 mg by mouth 2 (two) times daily. MAY TAKE ADDITIONAL DOSE IF NEEDED    . DULoxetine (CYMBALTA) 60 MG capsule Take 60 mg by mouth daily.    Marland Kitchen HYDROcodone-acetaminophen (NORCO/VICODIN) 5-325 MG tablet Take 1 tablet by mouth. Patient states that she takes 1/2tablet rarely.     . latanoprost (XALATAN) 0.005 % ophthalmic solution Place 1 drop into both eyes at bedtime.     Marland Kitchen omeprazole (PRILOSEC) 20 MG capsule Take 20 mg by mouth 2 (two) times daily before a meal.    . timolol (BETIMOL) 0.25 % ophthalmic solution Place 1 drop into both eyes every morning.     Marland Kitchen UNKNOWN TO PATIENT Blood pressure medication     No current facility-administered medications for this visit.    Allergies as of 04/05/2020 - Review Complete 04/05/2020  Allergen Reaction Noted  . Sulfa antibiotics  03/17/2013    Vitals: BP 119/78 (BP Location: Right Arm, Patient Position: Sitting)   Pulse 65   Ht 5\' 2"  (1.575 m)   Wt 154 lb (69.9 kg)   BMI 28.17 kg/m  Last Weight:  Wt Readings from Last 1 Encounters:  04/05/20 154 lb (69.9 kg)   Last Height:   Ht Readings from Last 1 Encounters:  04/05/20 5\' 2"  (1.575 m)     Physical exam: Exam: Gen: NAD, conversant, well nourised, obese, well groomed                     CV: RRR, no MRG. No Carotid Bruits. No peripheral edema, warm, nontender Eyes: Conjunctivae clear without exudates or hemorrhage  Neuro: Detailed Neurologic Exam  Speech:    Speech is normal; fluent and spontaneous with normal comprehension.  Cognition:  MMSE - Mini Mental State Exam 04/05/2020  Orientation to time 5  Orientation to Place 5  Registration 3  Attention/ Calculation 4  Recall 2  Language- name 2 objects 2  Language- repeat 1  Language- follow 3 step command 3  Language- read & follow direction 1  Write a sentence 1  Copy design 0  Total score 27       The patient is oriented to person, place, and time;     recent and remote memory intact;     language fluent;     normal attention, concentration,     fund of knowledge Cranial Nerves:    The pupils are equal, round, and reactive to light. The fundi are flat. Visual fields are full to finger confrontation. Extraocular  movements are intact. Trigeminal sensation is intact and the muscles of  mastication are normal. The face is symmetric. The palate elevates in the midline. Hearing intact. Voice is normal. Shoulder shrug is normal. The tongue has normal motion without fasciculations.   Coordination:    No dysmetria or ataxia  Gait:    Normal native gait  Motor Observation:    No asymmetry, no atrophy, and no involuntary movements noted. Tone:    Normal muscle tone.    Posture:    Posture is normal. normal erect    Strength:    Strength is V/V in the upper and lower limbs.      Sensation: intact to LT     Reflex Exam:  DTR's:   Absent AJs. Deep tendon reflexes in the patellars and biceps extremities are normal bilaterally.   Toes:    The toes are downgoing bilaterally.   Clonus:    Clonus is absent.    Assessment/Plan:  79 y.o. female here as requested by Asencion Noble, MD for memory disturbance. PMHx migraine, chronic kidney disease stage III, hyperlipidemia, glaucoma, GERD, depression, anxiety, recent lacunar stroke.  Neurologic exam is unremarkable. MMSE 27/30.  Below are my recommendations, patient declined all today and would like to speak with her primary care about this.  Return to clinic as necessary.  -I do not appreciate any neurocognitive degenerative issues with patient, this may likely be multifactorial including anxiety, medications, normal cognitive aging. discussed formal memory testing  As far as her stroke goes: We reviewed the lacunar infarct, as well as her chronic white matter microvascular ischemic changes which do put her at risk of further small vessel strokes.  I did recommend completing stroke evaluation including the following: - I would recommend a 30-day Holter monitor to evaluate for A. fib or other arrhythmias - she says she had an echocardiogram?  I do not see this in the records, I would recommend an echocardiogram to evaluate for PFO or other risk factors for cardioembolic strokes - Recommend Imaging of the blood vessels of the head  and neck -CTA of the head and neck - Recommended hgba1c if not artery completed  I had a long d/w patient about her recent stroke, risk for recurrent stroke/TIAs, personally independently reviewed imaging studies and stroke evaluation results and answered questions.Continue ASA for secondary stroke prevention and maintain strict control of hypertension with blood pressure goal below 130/90, diabetes with hemoglobin A1c goal below 6.5% and lipids with LDL cholesterol goal below 70 mg/dL.  I also advised the patient to eat a healthy diet with plenty of whole grains, cereals, fruits and vegetables, exercise regularly and maintain ideal body weight .Followup in the future with me in 2 months or call earlier if necessary.    Cc: Asencion Noble, MD,    I spent 60 minutes of face-to-face and non-face-to-face time with patient on the  1. Lacunar stroke ([place])   2. Subjective cognitive impairment    diagnosis.  This included previsit chart review, lab review, study review, order entry, electronic health record documentation, patient education on the different diagnostic and therapeutic options, counseling and coordination of care, risks and benefits of management, compliance, or risk factor reduction   Sarina Ill, MD  [facility] [address] [place], [place] [address]  Phone [phone] Fax [phone]

## 2020-04-05 NOTE — Patient Instructions (Addendum)
Recommendations:  Formal Memory Testing for memory concerns however I do not think you have neurodegenerative brain disease/dementia  Complete stroke evaluation: Echocardiogram and imaging of the blood vessels of the head and neck Blood test: Hgba1c  Continue ASA for secondary stroke prevention and maintain strict control of hypertension with blood pressure goal below 130/90, diabetes with hemoglobin A1c goal below 6.5% and lipids with LDL cholesterol goal below 70 mg/dL.  I also advised the patient to eat a healthy diet with plenty of whole grains, cereals, fruits and vegetables, exercise regularly and maintain ideal body weight   Stroke Prevention Some medical conditions and behaviors are associated with a higher chance of having a stroke. You can help prevent a stroke by making nutrition, lifestyle, and other changes, including managing any medical conditions you may have. What nutrition changes can be made?   Eat healthy foods. You can do this by: ? Choosing foods high in fiber, such as fresh fruits and vegetables and whole grains. ? Eating at least 5 or more servings of fruits and vegetables a day. Try to fill half of your plate at each meal with fruits and vegetables. ? Choosing lean protein foods, such as lean cuts of meat, poultry without skin, fish, tofu, beans, and nuts. ? Eating low-fat dairy products. ? Avoiding foods that are high in salt (sodium). This can help lower blood pressure. ? Avoiding foods that have saturated fat, trans fat, and cholesterol. This can help prevent high cholesterol. ? Avoiding processed and premade foods.  Follow your health care provider's specific guidelines for losing weight, controlling high blood pressure (hypertension), lowering high cholesterol, and managing diabetes. These may include: ? Reducing your daily calorie intake. ? Limiting your daily sodium intake to 1,500 milligrams (mg). ? Using only healthy fats for cooking, such as olive oil,  canola oil, or sunflower oil. ? Counting your daily carbohydrate intake. What lifestyle changes can be made?  Maintain a healthy weight. Talk to your health care provider about your ideal weight.  Get at least 30 minutes of moderate physical activity at least 5 days a week. Moderate activity includes brisk walking, biking, and swimming.  Do not use any products that contain nicotine or tobacco, such as cigarettes and e-cigarettes. If you need help quitting, ask your health care provider. It may also be helpful to avoid exposure to secondhand smoke.  Limit alcohol intake to no more than 1 drink a day for nonpregnant women and 2 drinks a day for men. One drink equals 12 oz of beer, 5 oz of wine, or 1 oz of hard liquor.  Stop any illegal drug use.  Avoid taking birth control pills. Talk to your health care provider about the risks of taking birth control pills if: ? You are over 41 years old. ? You smoke. ? You get migraines. ? You have ever had a blood clot. What other changes can be made?  Manage your cholesterol levels. ? Eating a healthy diet is important for preventing high cholesterol. If cholesterol cannot be managed through diet alone, you may also need to take medicines. ? Take any prescribed medicines to control your cholesterol as told by your health care provider.  Manage your diabetes. ? Eating a healthy diet and exercising regularly are important parts of managing your blood sugar. If your blood sugar cannot be managed through diet and exercise, you may need to take medicines. ? Take any prescribed medicines to control your diabetes as told by your health care provider.  Control your hypertension. ? To reduce your risk of stroke, try to keep your blood pressure below 130/80. ? Eating a healthy diet and exercising regularly are an important part of controlling your blood pressure. If your blood pressure cannot be managed through diet and exercise, you may need to take  medicines. ? Take any prescribed medicines to control hypertension as told by your health care provider. ? Ask your health care provider if you should monitor your blood pressure at home. ? Have your blood pressure checked every year, even if your blood pressure is normal. Blood pressure increases with age and some medical conditions.  Get evaluated for sleep disorders (sleep apnea). Talk to your health care provider about getting a sleep evaluation if you snore a lot or have excessive sleepiness.  Take over-the-counter and prescription medicines only as told by your health care provider. Aspirin or blood thinners (antiplatelets or anticoagulants) may be recommended to reduce your risk of forming blood clots that can lead to stroke.  Make sure that any other medical conditions you have, such as atrial fibrillation or atherosclerosis, are managed. What are the warning signs of a stroke? The warning signs of a stroke can be easily remembered as BEFAST.  B is for balance. Signs include: ? Dizziness. ? Loss of balance or coordination. ? Sudden trouble walking.  E is for eyes. Signs include: ? A sudden change in vision. ? Trouble seeing.  F is for face. Signs include: ? Sudden weakness or numbness of the face. ? The face or eyelid drooping to one side.  A is for arms. Signs include: ? Sudden weakness or numbness of the arm, usually on one side of the body.  S is for speech. Signs include: ? Trouble speaking (aphasia). ? Trouble understanding.  T is for time. ? These symptoms may represent a serious problem that is an emergency. Do not wait to see if the symptoms will go away. Get medical help right away. Call your local emergency services (911 in the [place] Do not drive yourself to the hospital.  Other signs of stroke may include: ? A sudden, severe headache with no known cause. ? Nausea or vomiting. ? Seizure. Where to find more information For more information,  visit:  American Stroke Association: www.strokeassociation.org  National Stroke Association: www.stroke.org Summary  You can prevent a stroke by eating healthy, exercising, not smoking, limiting alcohol intake, and managing any medical conditions you may have.  Do not use any products that contain nicotine or tobacco, such as cigarettes and e-cigarettes. If you need help quitting, ask your health care provider. It may also be helpful to avoid exposure to secondhand smoke.  Remember BEFAST for warning signs of stroke. Get help right away if you or a loved one has any of these signs. This information is not intended to replace advice given to you by your health care provider. Make sure you discuss any questions you have with your health care provider. Document Revised: 08/24/2017 Document Reviewed: 10/17/2016 Elsevier Patient Education  2020 [employer].

## 2020-06-03 ENCOUNTER — Ambulatory Visit: Payer: Medicare PPO | Attending: Internal Medicine

## 2020-06-03 DIAGNOSIS — Z23 Encounter for immunization: Secondary | ICD-10-CM

## 2020-06-03 NOTE — Progress Notes (Signed)
   Covid-19 Vaccination Clinic  Name:  Bridget Gay    MRN: [id number] DOB: [birth year]  06/03/2020  Bridget Gay was observed post Covid-19 immunization for 15 minutes without incident. She was provided with Vaccine Information Sheet and instruction to access the V-Safe system.   Bridget Gay was instructed to call 911 with any severe reactions post vaccine: Marland Kitchen Difficulty breathing  . Swelling of face and throat  . A fast heartbeat  . A bad rash all over body  . Dizziness and weakness

## 2020-06-30 DIAGNOSIS — G464 Cerebellar stroke syndrome: Secondary | ICD-10-CM | POA: Diagnosis not present

## 2020-06-30 DIAGNOSIS — N183 Chronic kidney disease, stage 3 unspecified: Secondary | ICD-10-CM | POA: Diagnosis not present

## 2020-06-30 DIAGNOSIS — Z79899 Other long term (current) drug therapy: Secondary | ICD-10-CM | POA: Diagnosis not present

## 2020-07-06 DIAGNOSIS — N1831 Chronic kidney disease, stage 3a: Secondary | ICD-10-CM | POA: Diagnosis not present

## 2020-07-06 DIAGNOSIS — Z8673 Personal history of transient ischemic attack (TIA), and cerebral infarction without residual deficits: Secondary | ICD-10-CM | POA: Diagnosis not present

## 2020-07-06 DIAGNOSIS — Z6828 Body mass index (BMI) 28.0-28.9, adult: Secondary | ICD-10-CM | POA: Diagnosis not present

## 2020-07-06 DIAGNOSIS — Z23 Encounter for immunization: Secondary | ICD-10-CM | POA: Diagnosis not present

## 2020-07-19 DIAGNOSIS — Z961 Presence of intraocular lens: Secondary | ICD-10-CM | POA: Diagnosis not present

## 2020-07-19 DIAGNOSIS — H401112 Primary open-angle glaucoma, right eye, moderate stage: Secondary | ICD-10-CM | POA: Diagnosis not present

## 2020-07-19 DIAGNOSIS — H43811 Vitreous degeneration, right eye: Secondary | ICD-10-CM | POA: Diagnosis not present

## 2020-07-19 DIAGNOSIS — H04123 Dry eye syndrome of bilateral lacrimal glands: Secondary | ICD-10-CM | POA: Diagnosis not present

## 2020-07-19 DIAGNOSIS — H401121 Primary open-angle glaucoma, left eye, mild stage: Secondary | ICD-10-CM | POA: Diagnosis not present

## 2020-07-19 DIAGNOSIS — H35413 Lattice degeneration of retina, bilateral: Secondary | ICD-10-CM | POA: Diagnosis not present

## 2020-07-19 DIAGNOSIS — H18421 Band keratopathy, right eye: Secondary | ICD-10-CM | POA: Diagnosis not present

## 2020-08-31 DIAGNOSIS — D692 Other nonthrombocytopenic purpura: Secondary | ICD-10-CM | POA: Diagnosis not present

## 2020-08-31 DIAGNOSIS — L570 Actinic keratosis: Secondary | ICD-10-CM | POA: Diagnosis not present

## 2020-08-31 DIAGNOSIS — L820 Inflamed seborrheic keratosis: Secondary | ICD-10-CM | POA: Diagnosis not present

## 2020-08-31 DIAGNOSIS — L821 Other seborrheic keratosis: Secondary | ICD-10-CM | POA: Diagnosis not present

## 2020-08-31 DIAGNOSIS — L814 Other melanin hyperpigmentation: Secondary | ICD-10-CM | POA: Diagnosis not present

## 2020-08-31 DIAGNOSIS — D485 Neoplasm of uncertain behavior of skin: Secondary | ICD-10-CM | POA: Diagnosis not present

## 2020-08-31 DIAGNOSIS — D1801 Hemangioma of skin and subcutaneous tissue: Secondary | ICD-10-CM | POA: Diagnosis not present

## 2020-10-19 ENCOUNTER — Other Ambulatory Visit (HOSPITAL_COMMUNITY): Payer: Self-pay | Admitting: Internal Medicine

## 2020-10-19 ENCOUNTER — Other Ambulatory Visit: Payer: Self-pay | Admitting: Internal Medicine

## 2020-10-19 DIAGNOSIS — R4182 Altered mental status, unspecified: Secondary | ICD-10-CM

## 2020-10-19 DIAGNOSIS — W19XXXA Unspecified fall, initial encounter: Secondary | ICD-10-CM | POA: Diagnosis not present

## 2020-10-19 DIAGNOSIS — R519 Headache, unspecified: Secondary | ICD-10-CM | POA: Diagnosis not present

## 2020-10-19 DIAGNOSIS — Z79899 Other long term (current) drug therapy: Secondary | ICD-10-CM | POA: Diagnosis not present

## 2020-10-19 DIAGNOSIS — R296 Repeated falls: Secondary | ICD-10-CM | POA: Diagnosis not present

## 2020-10-25 ENCOUNTER — Ambulatory Visit (HOSPITAL_COMMUNITY)
Admission: RE | Admit: 2020-10-25 | Discharge: 2020-10-25 | Disposition: A | Discharge status: Home / Self Care | Payer: Medicare PPO | Source: Ambulatory Visit | Attending: Internal Medicine | Admitting: Internal Medicine

## 2020-10-25 ENCOUNTER — Other Ambulatory Visit: Payer: Self-pay

## 2020-10-25 DIAGNOSIS — R296 Repeated falls: Secondary | ICD-10-CM | POA: Diagnosis not present

## 2020-10-25 DIAGNOSIS — I6782 Cerebral ischemia: Secondary | ICD-10-CM | POA: Diagnosis not present

## 2020-10-25 DIAGNOSIS — R4182 Altered mental status, unspecified: Secondary | ICD-10-CM | POA: Insufficient documentation

## 2020-10-25 DIAGNOSIS — R42 Dizziness and giddiness: Secondary | ICD-10-CM | POA: Diagnosis not present

## 2020-10-25 DIAGNOSIS — G9389 Other specified disorders of brain: Secondary | ICD-10-CM | POA: Diagnosis not present

## 2020-10-25 MED ORDER — GADOBUTROL 1 MMOL/ML IV SOLN
7.0000 mL | Freq: Once | INTRAVENOUS | Status: AC | PRN
  Administered 2020-10-25: 7 mL via INTRAVENOUS

## 2020-10-26 DIAGNOSIS — R413 Other amnesia: Secondary | ICD-10-CM | POA: Diagnosis not present

## 2020-10-26 DIAGNOSIS — Z8673 Personal history of transient ischemic attack (TIA), and cerebral infarction without residual deficits: Secondary | ICD-10-CM | POA: Diagnosis not present

## 2020-11-01 ENCOUNTER — Encounter (HOSPITAL_COMMUNITY): Payer: Self-pay

## 2020-11-01 ENCOUNTER — Other Ambulatory Visit (HOSPITAL_COMMUNITY): Payer: Medicare PPO

## 2020-12-20 DIAGNOSIS — Z79899 Other long term (current) drug therapy: Secondary | ICD-10-CM | POA: Diagnosis not present

## 2020-12-20 DIAGNOSIS — R413 Other amnesia: Secondary | ICD-10-CM | POA: Diagnosis not present

## 2020-12-20 DIAGNOSIS — I6381 Other cerebral infarction due to occlusion or stenosis of small artery: Secondary | ICD-10-CM | POA: Diagnosis not present

## 2020-12-27 DIAGNOSIS — Z8673 Personal history of transient ischemic attack (TIA), and cerebral infarction without residual deficits: Secondary | ICD-10-CM | POA: Diagnosis not present

## 2020-12-27 DIAGNOSIS — R413 Other amnesia: Secondary | ICD-10-CM | POA: Diagnosis not present

## 2021-01-03 ENCOUNTER — Institutional Professional Consult (permissible substitution): Payer: Medicare PPO | Admitting: Neurology

## 2021-01-18 DIAGNOSIS — H401121 Primary open-angle glaucoma, left eye, mild stage: Secondary | ICD-10-CM | POA: Diagnosis not present

## 2021-01-18 DIAGNOSIS — H401113 Primary open-angle glaucoma, right eye, severe stage: Secondary | ICD-10-CM | POA: Diagnosis not present

## 2021-04-19 DIAGNOSIS — F320 Major depressive disorder, single episode, mild: Secondary | ICD-10-CM | POA: Diagnosis not present

## 2021-04-19 DIAGNOSIS — R413 Other amnesia: Secondary | ICD-10-CM | POA: Diagnosis not present

## 2021-05-10 DIAGNOSIS — H35413 Lattice degeneration of retina, bilateral: Secondary | ICD-10-CM | POA: Diagnosis not present

## 2021-05-10 DIAGNOSIS — H04123 Dry eye syndrome of bilateral lacrimal glands: Secondary | ICD-10-CM | POA: Diagnosis not present

## 2021-05-10 DIAGNOSIS — H401121 Primary open-angle glaucoma, left eye, mild stage: Secondary | ICD-10-CM | POA: Diagnosis not present

## 2021-05-10 DIAGNOSIS — H43811 Vitreous degeneration, right eye: Secondary | ICD-10-CM | POA: Diagnosis not present

## 2021-05-10 DIAGNOSIS — H401113 Primary open-angle glaucoma, right eye, severe stage: Secondary | ICD-10-CM | POA: Diagnosis not present

## 2021-05-10 DIAGNOSIS — Z961 Presence of intraocular lens: Secondary | ICD-10-CM | POA: Diagnosis not present

## 2021-05-10 DIAGNOSIS — H18421 Band keratopathy, right eye: Secondary | ICD-10-CM | POA: Diagnosis not present

## 2021-07-20 ENCOUNTER — Other Ambulatory Visit (HOSPITAL_COMMUNITY): Payer: Self-pay | Admitting: Internal Medicine

## 2021-07-20 DIAGNOSIS — I6381 Other cerebral infarction due to occlusion or stenosis of small artery: Secondary | ICD-10-CM | POA: Diagnosis not present

## 2021-07-20 DIAGNOSIS — Z79899 Other long term (current) drug therapy: Secondary | ICD-10-CM | POA: Diagnosis not present

## 2021-07-20 DIAGNOSIS — Z1231 Encounter for screening mammogram for malignant neoplasm of breast: Secondary | ICD-10-CM

## 2021-07-20 DIAGNOSIS — F419 Anxiety disorder, unspecified: Secondary | ICD-10-CM | POA: Diagnosis not present

## 2021-07-20 DIAGNOSIS — I69811 Memory deficit following other cerebrovascular disease: Secondary | ICD-10-CM | POA: Diagnosis not present

## 2021-07-20 DIAGNOSIS — F329 Major depressive disorder, single episode, unspecified: Secondary | ICD-10-CM | POA: Diagnosis not present

## 2021-07-20 DIAGNOSIS — E785 Hyperlipidemia, unspecified: Secondary | ICD-10-CM | POA: Diagnosis not present

## 2021-07-26 DIAGNOSIS — Z23 Encounter for immunization: Secondary | ICD-10-CM | POA: Diagnosis not present

## 2021-07-26 DIAGNOSIS — Z8673 Personal history of transient ischemic attack (TIA), and cerebral infarction without residual deficits: Secondary | ICD-10-CM | POA: Diagnosis not present

## 2021-07-26 DIAGNOSIS — N183 Chronic kidney disease, stage 3 unspecified: Secondary | ICD-10-CM | POA: Diagnosis not present

## 2021-07-26 DIAGNOSIS — F320 Major depressive disorder, single episode, mild: Secondary | ICD-10-CM | POA: Diagnosis not present

## 2021-07-29 ENCOUNTER — Ambulatory Visit (HOSPITAL_COMMUNITY)
Admission: RE | Admit: 2021-07-29 | Discharge: 2021-07-29 | Disposition: A | Discharge status: Home / Self Care | Payer: Medicare PPO | Source: Ambulatory Visit | Attending: Internal Medicine | Admitting: Internal Medicine

## 2021-07-29 ENCOUNTER — Other Ambulatory Visit: Payer: Self-pay

## 2021-07-29 DIAGNOSIS — Z1231 Encounter for screening mammogram for malignant neoplasm of breast: Secondary | ICD-10-CM | POA: Insufficient documentation

## 2021-10-27 DIAGNOSIS — Z8673 Personal history of transient ischemic attack (TIA), and cerebral infarction without residual deficits: Secondary | ICD-10-CM | POA: Diagnosis not present

## 2021-10-27 DIAGNOSIS — F320 Major depressive disorder, single episode, mild: Secondary | ICD-10-CM | POA: Diagnosis not present

## 2021-11-30 DIAGNOSIS — H401121 Primary open-angle glaucoma, left eye, mild stage: Secondary | ICD-10-CM | POA: Diagnosis not present

## 2021-11-30 DIAGNOSIS — H401113 Primary open-angle glaucoma, right eye, severe stage: Secondary | ICD-10-CM | POA: Diagnosis not present

## 2021-12-05 ENCOUNTER — Telehealth: Payer: Self-pay | Admitting: Cardiology

## 2021-12-05 NOTE — Telephone Encounter (Signed)
STAT if HR is under 50 or over 120 ?(normal HR is 60-100 beats per minute) ? ?What is your heart rate? 84, 91 while on the phone ?*patient reports she was making the bed while she was on the phone ? ?Do you have a log of your heart rate readings (document readings)? No. Patient wears a FitBit and said she could look to see her past HR record if needed  ? ?Do you have any other symptoms? Occasional SOB/ Heavy breathing ? ?Patient said she notices the SOB and heavy breathing after activity or when she stands up. Sometimes she gets dizzy upon standing  ? ?Patient is overdue for a follow-up visit  ? ?

## 2021-12-05 NOTE — Telephone Encounter (Signed)
Attempted to contact pt to offer 3:05 pm appt with C. Kathlen Mody, PA-C today. Unable to leave a message on pt's vmail.  ?

## 2021-12-07 NOTE — Telephone Encounter (Signed)
MyChart message sent to pt- active on MyChart ?

## 2021-12-07 NOTE — Telephone Encounter (Signed)
Spoke to pt who stated that for the past couple of weeks she has been experiencing severe sob and thoracic pressure. Pt denies cp. Pt stated that her hr has also been elevated and walking from the bathroom to the kitchen brings her hr to 95 bpm, while walking a small incline brings hr to 127 bpm. Pt has o/d ov with provider 02/03/22 @ 11am in the [place] office.  ? ?Please advise.   ?

## 2021-12-07 NOTE — Telephone Encounter (Signed)
Attempted to contact pt to schedule office visit. Unable to leave a message on pt's vmail ?

## 2021-12-11 NOTE — Telephone Encounter (Signed)
Can she come in for a nursing visit for vitals and EKG please. May need to start a beta blocker in near future for symptoms. Can we get more description of what she means by thoracic pressure. Is she talking about pressue in the chest or in the thoracic spine(mid back).  ? ?Zandra Abts MD ?

## 2021-12-13 NOTE — Telephone Encounter (Signed)
Attempted to call pt. Mail box full.  °

## 2021-12-13 NOTE — Telephone Encounter (Signed)
Pt agreeable to come for nurse visit- EKG/Vitals- 03/222/23.  ?Pt stated she feels a heaviness in her chest area.  ?

## 2021-12-14 ENCOUNTER — Ambulatory Visit (INDEPENDENT_AMBULATORY_CARE_PROVIDER_SITE_OTHER): Payer: Medicare PPO | Admitting: *Deleted

## 2021-12-14 ENCOUNTER — Other Ambulatory Visit: Payer: Self-pay

## 2021-12-14 VITALS — BP 124/68 | HR 80 | Ht 63.0 in | Wt 158.8 lb

## 2021-12-14 DIAGNOSIS — R002 Palpitations: Secondary | ICD-10-CM | POA: Diagnosis not present

## 2021-12-14 NOTE — Progress Notes (Signed)
Pt in office for nurse visit to check EKG and vitals. States that she feels fine.  ?

## 2021-12-19 ENCOUNTER — Encounter: Payer: Self-pay | Admitting: Cardiology

## 2021-12-22 DIAGNOSIS — D485 Neoplasm of uncertain behavior of skin: Secondary | ICD-10-CM | POA: Diagnosis not present

## 2021-12-22 DIAGNOSIS — L820 Inflamed seborrheic keratosis: Secondary | ICD-10-CM | POA: Diagnosis not present

## 2021-12-22 DIAGNOSIS — L570 Actinic keratosis: Secondary | ICD-10-CM | POA: Diagnosis not present

## 2021-12-22 DIAGNOSIS — L814 Other melanin hyperpigmentation: Secondary | ICD-10-CM | POA: Diagnosis not present

## 2021-12-22 DIAGNOSIS — C44629 Squamous cell carcinoma of skin of left upper limb, including shoulder: Secondary | ICD-10-CM | POA: Diagnosis not present

## 2021-12-22 DIAGNOSIS — D692 Other nonthrombocytopenic purpura: Secondary | ICD-10-CM | POA: Diagnosis not present

## 2021-12-22 DIAGNOSIS — L821 Other seborrheic keratosis: Secondary | ICD-10-CM | POA: Diagnosis not present

## 2022-01-03 DIAGNOSIS — C44629 Squamous cell carcinoma of skin of left upper limb, including shoulder: Secondary | ICD-10-CM | POA: Diagnosis not present

## 2022-01-03 DIAGNOSIS — Z85828 Personal history of other malignant neoplasm of skin: Secondary | ICD-10-CM | POA: Diagnosis not present

## 2022-01-03 DIAGNOSIS — L720 Epidermal cyst: Secondary | ICD-10-CM | POA: Diagnosis not present

## 2022-01-17 DIAGNOSIS — I639 Cerebral infarction, unspecified: Secondary | ICD-10-CM | POA: Diagnosis not present

## 2022-01-17 DIAGNOSIS — Z79899 Other long term (current) drug therapy: Secondary | ICD-10-CM | POA: Diagnosis not present

## 2022-01-17 DIAGNOSIS — F32A Depression, unspecified: Secondary | ICD-10-CM | POA: Diagnosis not present

## 2022-01-24 DIAGNOSIS — F320 Major depressive disorder, single episode, mild: Secondary | ICD-10-CM | POA: Diagnosis not present

## 2022-01-24 DIAGNOSIS — N1831 Chronic kidney disease, stage 3a: Secondary | ICD-10-CM | POA: Diagnosis not present

## 2022-02-03 ENCOUNTER — Ambulatory Visit: Payer: Medicare PPO | Admitting: Cardiology

## 2022-02-03 ENCOUNTER — Encounter: Payer: Self-pay | Admitting: Cardiology

## 2022-02-03 VITALS — BP 110/70 | HR 62 | Ht 61.0 in | Wt 158.6 lb

## 2022-02-03 DIAGNOSIS — R0789 Other chest pain: Secondary | ICD-10-CM

## 2022-02-03 DIAGNOSIS — R0609 Other forms of dyspnea: Secondary | ICD-10-CM

## 2022-02-03 NOTE — Progress Notes (Signed)
? ? ? ?Clinical Summary ?Ms. Dedmon is a 81 y.o.female seen today for follow up of the following medical problems.  ?  ?  ?1. Tachycardia/palpitations/Chest pain ?- some recent elevated HRs x 1 year ?- feeling of heart racing, can get diaphoretic. Can get dizzy ?- most commonly occurs with exertion. Lasts a few minutes. Episodes about 2-3 times a week.  ?- walks 3 miles regularly without troubles.  ?  ?- half caf x 2 cups, rare tea, no sodas, no energy drinks, wine 5 nights a week ?  ?  ?12/2019 heart monitor showed SR, rare PVCs. No arrhythmias.  ? ?- episodes of heart racing, HRs to 130s on watch. +SOB, fatigue, weak. Would last a few minutes at a time. comes on with activity. ?- no recent edema ?- pressure in chest at times, midchest/epigastric. Lasts a few minutes.  ?- DOE with low levels activity. Diaphoretic.  ? ? ?  ? ?Past Medical History:  ?Diagnosis Date  ? Anxiety   ? Depression   ? Disorder of cartilage, unspecified   ? Fracture of unsp phalanx of finger, subs for fx w routn heal   ? from a fall this year  ? Fx ankle   ? GERD (gastroesophageal reflux disease) 06/05/2017  ? Glaucoma   ? Hyperlipidemia   ? Migraine   ? Postmenopausal syndrome   ? ? ? ?Allergies  ?Allergen Reactions  ? Sulfa Antibiotics   ?  unknown  ? ? ? ?Current Outpatient Medications  ?Medication Sig Dispense Refill  ? atorvastatin (LIPITOR) 20 MG tablet Take 20 mg by mouth at bedtime.    ? betamethasone dipropionate 0.05 % cream Apply topically 2 (two) times daily.    ? cetirizine (ZYRTEC) 10 MG tablet Take 10 mg by mouth daily as needed for allergies.     ? chlordiazePOXIDE (LIBRIUM) 25 MG capsule Take 25 mg by mouth in the morning and at bedtime. As needed.    ? dicyclomine (BENTYL) 10 MG capsule Take 10 mg by mouth 2 (two) times daily. MAY TAKE ADDITIONAL DOSE IF NEEDED    ? DULoxetine (CYMBALTA) 60 MG capsule Take 60 mg by mouth daily.    ? HYDROcodone-acetaminophen (NORCO/VICODIN) 5-325 MG tablet Take 1 tablet by mouth. Patient  states that she takes 1/2tablet rarely.    ? latanoprost (XALATAN) 0.005 % ophthalmic solution Place 1 drop into both eyes at bedtime.     ? omeprazole (PRILOSEC) 20 MG capsule Take 20 mg by mouth 2 (two) times daily before a meal.    ? timolol (BETIMOL) 0.25 % ophthalmic solution Place 1 drop into both eyes every morning.     ? UNKNOWN TO PATIENT Blood pressure medication    ? ?No current facility-administered medications for this visit.  ? ? ? ?Past Surgical History:  ?Procedure Laterality Date  ? ABDOMINAL HYSTERECTOMY    ? COLONOSCOPY N/A 04/10/2013  ? Procedure: COLONOSCOPY;  Surgeon: Rogene Houston, MD;  [facility];  Service: Endoscopy;  Laterality: N/A;  830  ? ESOPHAGOGASTRODUODENOSCOPY N/A 06/29/2017  ? Procedure: ESOPHAGOGASTRODUODENOSCOPY (EGD) WITH  ESOPHAGEAL DILATION;  Surgeon: Rogene Houston, MD;  Location: [facility];  Service: Endoscopy;  Laterality: N/A;  1:00  ? TONSILLECTOMY    ? TUBAL LIGATION    ? ? ? ?Allergies  ?Allergen Reactions  ? Sulfa Antibiotics   ?  unknown  ? ? ? ? ?Family History  ?Problem Relation Age of Onset  ? Depression Mother   ?  wasn't diagnosed   ? Alzheimer's disease Neg Hx   ? Dementia Neg Hx   ? ? ? ?Social History ?Ms. Pennick reports that she has never smoked. She has never used smokeless tobacco. ?Ms. Hession reports current alcohol use. ? ? ?Review of Systems ?CONSTITUTIONAL: No weight loss, fever, chills, weakness or fatigue.  ?HEENT: Eyes: No visual loss, blurred vision, double vision or yellow sclerae.No hearing loss, sneezing, congestion, runny nose or sore throat.  ?SKIN: No rash or itching.  ?CARDIOVASCULAR: per hpi ?RESPIRATORY: per hpi ?GASTROINTESTINAL: No anorexia, nausea, vomiting or diarrhea. No abdominal pain or blood.  ?GENITOURINARY: No burning on urination, no polyuria ?NEUROLOGICAL: No headache, dizziness, syncope, paralysis, ataxia, numbness or tingling in the extremities. No change in bowel or bladder control.  ?MUSCULOSKELETAL: No  muscle, back pain, joint pain or stiffness.  ?LYMPHATICS: No enlarged nodes. No history of splenectomy.  ?PSYCHIATRIC: No history of depression or anxiety.  ?ENDOCRINOLOGIC: No reports of sweating, cold or heat intolerance. No polyuria or polydipsia.  ?. ? ? ?Physical Examination ?Today's Vitals  ? 02/03/22 1102  ?BP: 110/70  ?Pulse: 62  ?SpO2: 98%  ?Weight: 158 lb 9.6 oz (71.9 kg)  ?Height: '5\' 1"'$  (1.549 m)  ? ?Body mass index is 29.97 kg/m?. ? ?Gen: resting comfortably, no acute distress ?HEENT: no scleral icterus, pupils equal round and reactive, no palptable cervical adenopathy,  ?CV: RRR, no m/r/g no jvd ?Resp: Clear to auscultation bilaterally ?GI: abdomen is soft, non-tender, non-distended, normal bowel sounds, no hepatosplenomegaly ?MSK: extremities are warm, no edema.  ?Skin: warm, no rash ?Neuro:  no focal deficits ?Psych: appropriate affect ? ? ? ?Assessment and Plan  ? ? ?1. Tachycardia/DOE/chest pain ?- prior monitor benign ? Symptoms are more exertional over time, DOE with low levels of activity. Intermittent chest pains that can be pressure and independent from palpitations ?- obtain echo, pending results likely would plan for exercise nuclear stress.  ?- if benign workup may consider trial of low dose beta blocker for symptoms ? ? ?Arnoldo Lenis, M.D ?

## 2022-02-03 NOTE — Patient Instructions (Signed)

## 2022-02-07 ENCOUNTER — Ambulatory Visit (INDEPENDENT_AMBULATORY_CARE_PROVIDER_SITE_OTHER): Payer: Medicare PPO

## 2022-02-07 DIAGNOSIS — R0609 Other forms of dyspnea: Secondary | ICD-10-CM | POA: Diagnosis not present

## 2022-02-07 LAB — ECHOCARDIOGRAM COMPLETE
AV Vena cont: 0.2 cm
Calc EF: 57.2 %
MV M vel: 5.49 m/s
MV Peak grad: 120.6 mmHg
P 1/2 time: 857 msec
S' Lateral: 2.9 cm
Single Plane A2C EF: 56.9 %
Single Plane A4C EF: 57.2 %

## 2022-02-14 ENCOUNTER — Telehealth: Payer: Self-pay | Admitting: Cardiology

## 2022-02-14 DIAGNOSIS — R0789 Other chest pain: Secondary | ICD-10-CM

## 2022-02-14 DIAGNOSIS — R079 Chest pain, unspecified: Secondary | ICD-10-CM

## 2022-02-14 NOTE — Telephone Encounter (Signed)
Patient notified via detailed voice message that results on provider desktop awaiting his final review.

## 2022-02-14 NOTE — Telephone Encounter (Signed)
Follow Up:    Patient said she had her Echo on 02-07-22. She says she needs to have someone to call her results from My Chart please.

## 2022-02-15 ENCOUNTER — Encounter: Payer: Self-pay | Admitting: Cardiology

## 2022-02-17 ENCOUNTER — Encounter: Payer: Self-pay | Admitting: *Deleted

## 2022-02-17 NOTE — Telephone Encounter (Signed)
Laurine Blazer, LPN  1/61/0960  4:54 PM EDT Back to Top    Notified, copy to pcp.  She agrees to doing the stress test.  Will enter order & notify pcc for scheduling.    Arnoldo Lenis, MD  02/17/2022 12:56 PM EDT     Echo looks fine, heart pumping function is normal. Two heart valves have just very mild leak which is not of concern. Please obtain exercise nuclear stress for chest pain   Zandra Abts MD

## 2022-03-06 ENCOUNTER — Other Ambulatory Visit (HOSPITAL_COMMUNITY): Payer: Medicare PPO

## 2022-03-06 ENCOUNTER — Encounter (HOSPITAL_COMMUNITY): Payer: Medicare PPO

## 2022-03-16 ENCOUNTER — Encounter (HOSPITAL_COMMUNITY): Payer: Medicare PPO

## 2022-03-21 ENCOUNTER — Ambulatory Visit (HOSPITAL_BASED_OUTPATIENT_CLINIC_OR_DEPARTMENT_OTHER)
Admission: RE | Admit: 2022-03-21 | Discharge: 2022-03-21 | Disposition: A | Discharge status: Home / Self Care | Payer: Medicare PPO | Source: Ambulatory Visit | Attending: Cardiology | Admitting: Cardiology

## 2022-03-21 ENCOUNTER — Ambulatory Visit (HOSPITAL_COMMUNITY)
Admission: RE | Admit: 2022-03-21 | Discharge: 2022-03-21 | Disposition: A | Discharge status: Home / Self Care | Payer: Medicare PPO | Source: Ambulatory Visit | Attending: Cardiology | Admitting: Cardiology

## 2022-03-21 ENCOUNTER — Encounter (HOSPITAL_COMMUNITY): Payer: Self-pay

## 2022-03-21 DIAGNOSIS — R079 Chest pain, unspecified: Secondary | ICD-10-CM | POA: Insufficient documentation

## 2022-03-21 DIAGNOSIS — R0789 Other chest pain: Secondary | ICD-10-CM

## 2022-03-21 HISTORY — DX: Malignant (primary) neoplasm, unspecified: C80.1

## 2022-03-21 LAB — NM MYOCAR MULTI W/SPECT W/WALL MOTION / EF
Angina Index: 0
Duke Treadmill Score: 3
Estimated workload: 4.6
Exercise duration (min): 2 min
Exercise duration (sec): 47 s
LV dias vol: 35 mL (ref 46–106)
LV sys vol: 4 mL
MPHR: 140 {beats}/min
Nuc Stress EF: 88 %
Peak HR: 136 {beats}/min
Percent HR: 97 %
RATE: 0.4
RPE: 11
Rest HR: 77 {beats}/min
Rest Nuclear Isotope Dose: 10.8 mCi
SDS: 2
SRS: 3
SSS: 5
ST Depression (mm): 0 mm
Stress Nuclear Isotope Dose: 32 mCi
TID: 1

## 2022-03-21 MED ORDER — REGADENOSON 0.4 MG/5ML IV SOLN
INTRAVENOUS | Status: AC
  Filled 2022-03-21: qty 5

## 2022-03-21 MED ORDER — TECHNETIUM TC 99M TETROFOSMIN IV KIT
10.0000 | PACK | Freq: Once | INTRAVENOUS | Status: AC | PRN
  Administered 2022-03-21: 10.8 via INTRAVENOUS

## 2022-03-21 MED ORDER — SODIUM CHLORIDE FLUSH 0.9 % IV SOLN
INTRAVENOUS | Status: AC
  Administered 2022-03-21: 10 mL via INTRAVENOUS
  Filled 2022-03-21: qty 10

## 2022-03-21 MED ORDER — TECHNETIUM TC 99M TETROFOSMIN IV KIT
30.0000 | PACK | Freq: Once | INTRAVENOUS | Status: AC | PRN
  Administered 2022-03-21: 32 via INTRAVENOUS

## 2022-04-11 ENCOUNTER — Encounter: Payer: Self-pay | Admitting: *Deleted

## 2022-04-27 DIAGNOSIS — N1831 Chronic kidney disease, stage 3a: Secondary | ICD-10-CM | POA: Diagnosis not present

## 2022-04-27 DIAGNOSIS — Z79899 Other long term (current) drug therapy: Secondary | ICD-10-CM | POA: Diagnosis not present

## 2022-04-27 DIAGNOSIS — F32A Depression, unspecified: Secondary | ICD-10-CM | POA: Diagnosis not present

## 2022-04-27 DIAGNOSIS — I6381 Other cerebral infarction due to occlusion or stenosis of small artery: Secondary | ICD-10-CM | POA: Diagnosis not present

## 2022-06-19 DIAGNOSIS — F320 Major depressive disorder, single episode, mild: Secondary | ICD-10-CM | POA: Diagnosis not present

## 2022-06-19 DIAGNOSIS — N1831 Chronic kidney disease, stage 3a: Secondary | ICD-10-CM | POA: Diagnosis not present

## 2022-06-19 DIAGNOSIS — Z23 Encounter for immunization: Secondary | ICD-10-CM | POA: Diagnosis not present

## 2022-06-19 DIAGNOSIS — K219 Gastro-esophageal reflux disease without esophagitis: Secondary | ICD-10-CM | POA: Diagnosis not present

## 2022-06-20 ENCOUNTER — Encounter (INDEPENDENT_AMBULATORY_CARE_PROVIDER_SITE_OTHER): Payer: Self-pay | Admitting: *Deleted

## 2022-07-03 ENCOUNTER — Other Ambulatory Visit (INDEPENDENT_AMBULATORY_CARE_PROVIDER_SITE_OTHER): Payer: Self-pay

## 2022-07-03 ENCOUNTER — Encounter (INDEPENDENT_AMBULATORY_CARE_PROVIDER_SITE_OTHER): Payer: Self-pay

## 2022-07-03 ENCOUNTER — Encounter (INDEPENDENT_AMBULATORY_CARE_PROVIDER_SITE_OTHER): Payer: Self-pay | Admitting: Gastroenterology

## 2022-07-03 ENCOUNTER — Ambulatory Visit (INDEPENDENT_AMBULATORY_CARE_PROVIDER_SITE_OTHER): Payer: Medicare PPO | Admitting: Gastroenterology

## 2022-07-03 VITALS — BP 110/73 | HR 103 | Temp 98.2°F | Ht 61.0 in | Wt 154.8 lb

## 2022-07-03 DIAGNOSIS — K219 Gastro-esophageal reflux disease without esophagitis: Secondary | ICD-10-CM | POA: Diagnosis not present

## 2022-07-03 DIAGNOSIS — R1310 Dysphagia, unspecified: Secondary | ICD-10-CM

## 2022-07-03 NOTE — Progress Notes (Signed)
Referring Provider: Asencion Noble, MD Primary Care Physician:  Asencion Noble, MD Primary GI Physician: newly established   Chief Complaint  Patient presents with   Dysphagia    New patient. Referred for dysphagia. Pain in chest. Feels like food gets stuck.    HPI:   Bridget Gay is a 81 y.o. female with past medical history of anxiety, cancer, depression, GERD, glaucoma, HLD.   Patient presenting today as a new patient for dysphagia.   Patient reports that she has issues with foods, typically breads and meats sticking in mid chest. She notes this has been ongoing for the past few months, occurring maybe once per week. She notes some pain when food gets stuck but it will eventually pass down. She denies needing to throw food back up. She is more recently having more belching. She notes heartburn 2-3x/week. She is maintained on low dose omeprazole BID. She thinks she has been on this for a while though is unsure as husband manages all of her meds. She notes more acid regurgitation than heartburn usually. Denies nausea or vomiting. Denies changes to her appetite. She does note eating less but this has been progressive over time, thinks it may be related to decrease in activity. No weight loss. No rectal bleeding or melena.   NSAID use: only baby aspirin Social hx: no tobacco, etoh 1 glass of wine 3-4x/week  Fam hx: father had Colon cancer, mother had Crohn's disease   Last Colonoscopy:03/31/13 Examination performed to cecum. Mild sigmoid colon diverticulosis. Small external hemorrhoids and 2 anal papillae. No evidence of colonic polyps. Last Endoscopy:06/29/17- Normal esophagus. Dilated. - Z-line regular, 33 cm from the incisors. - 4 cm hiatal hernia. - Normal stomach. - Normal duodenal bulb and second portion of the duodenum. - No specimens collected.  Recommendations:  Recommended repeat TCS in 2019 due to family history of CRC  Past Medical History:  Diagnosis Date   Anxiety    Cancer  ([place])    Skin   Depression    Disorder of cartilage, unspecified    Fracture of unsp phalanx of finger, subs for fx w routn heal    from a fall this year   Fx ankle    GERD (gastroesophageal reflux disease) 06/05/2017   Glaucoma    Hyperlipidemia    Migraine    Postmenopausal syndrome     Past Surgical History:  Procedure Laterality Date   ABDOMINAL HYSTERECTOMY     COLONOSCOPY N/A 04/10/2013   Procedure: COLONOSCOPY;  Surgeon: Rogene Houston, MD;  [facility];  Service: Endoscopy;  Laterality: N/A;  830   ESOPHAGOGASTRODUODENOSCOPY N/A 06/29/2017   Procedure: ESOPHAGOGASTRODUODENOSCOPY (EGD) WITH  ESOPHAGEAL DILATION;  Surgeon: Rogene Houston, MD;  [facility];  Service: Endoscopy;  Laterality: N/A;  1:00   TONSILLECTOMY     TUBAL LIGATION      Current Outpatient Medications  Medication Sig Dispense Refill   aspirin EC 81 MG tablet Take 81 mg by mouth daily. Swallow whole.     atorvastatin (LIPITOR) 20 MG tablet Take 20 mg by mouth at bedtime.     Calcium Carbonate-Vitamin D (CALCIUM 600+D PO) Take by mouth. Vit d 2000 iu     cetirizine (ZYRTEC) 10 MG tablet Take 10 mg by mouth daily as needed for allergies.      chlordiazePOXIDE (LIBRIUM) 25 MG capsule Take 25 mg by mouth in the morning and at bedtime. As needed.     COMBIGAN 0.2-0.5 %  ophthalmic solution Apply 1 drop to eye 2 (two) times daily.     Cyanocobalamin (VITAMIN B 12 PO) Take by mouth. 500 mcg daily     dicyclomine (BENTYL) 10 MG capsule Take 10 mg by mouth 2 (two) times daily. MAY TAKE ADDITIONAL DOSE IF NEEDED     DULoxetine (CYMBALTA) 30 MG capsule Take 30 mg by mouth daily.     DULoxetine (CYMBALTA) 60 MG capsule Take 60 mg by mouth daily.     omeprazole (PRILOSEC) 20 MG capsule Take 20 mg by mouth 2 (two) times daily before a meal.     No current facility-administered medications for this visit.    Allergies as of 07/03/2022 - Review Complete 07/03/2022  Allergen Reaction Noted    Sulfa antibiotics  03/17/2013    Family History  Problem Relation Age of Onset   Depression Mother        wasn't diagnosed    Alzheimer's disease Neg Hx    Dementia Neg Hx     Social History   Socioeconomic History   Marital status: Married    Spouse name: Not on file   Number of children: Not on file   Years of education: Not on file   Highest education level: Master's degree (e.g., MA, MS, MEng, MEd, MSW, MBA)  Occupational History   Not on file  Tobacco Use   Smoking status: Never   Smokeless tobacco: Never  Vaping Use   Vaping Use: Never used  Substance and Sexual Activity   Alcohol use: Yes    Alcohol/week: 0.0 standard drinks of alcohol    Comment: occasionally   Drug use: No   Sexual activity: Not on file  Other Topics Concern   Not on file  Social History Narrative   Lives at home    Right handed   Caffeine: 2 cups coffee in the morning and maybe a tea at lunchtime   Social Determinants of Health   Financial Resource Strain: Not on file  Food Insecurity: Not on file  Transportation Needs: Not on file  Physical Activity: Not on file  Stress: Not on file  Social Connections: Not on file    Review of systems General: negative for malaise, night sweats, fever, chills, weight loss Neck: Negative for lumps, goiter, pain and significant neck swelling Resp: Negative for cough, wheezing, dyspnea at rest CV: Negative for chest pain, leg swelling, palpitations, orthopnea GI: denies melena, hematochezia, nausea, vomiting, diarrhea, constipation, early satiety or unintentional weight loss. +GERD symptoms +dysphagia  MSK: Negative for joint pain or swelling, back pain, and muscle pain. Derm: Negative for itching or rash Psych: Denies depression, anxiety, memory loss, confusion. No homicidal or suicidal ideation.  Heme: Negative for prolonged bleeding, bruising easily, and swollen nodes. Endocrine: Negative for cold or heat intolerance, polyuria, polydipsia and  goiter. Neuro: negative for tremor, gait imbalance, syncope and seizures. The remainder of the review of systems is noncontributory.  Physical Exam: BP 110/73 (BP Location: Left Arm, Patient Position: Sitting, Cuff Size: Large)   Pulse (!) 103   Temp 98.2 F (36.8 C) (Oral)   Ht '5\' 1"'$  (1.549 m)   Wt 154 lb 12.8 oz (70.2 kg)   BMI 29.25 kg/m  General:   Alert and oriented. No distress noted. Pleasant and cooperative.  Head:  Normocephalic and atraumatic. Eyes:  Conjuctiva clear without scleral icterus. Mouth:  Oral mucosa pink and moist. Good dentition. No lesions. Heart: Normal rate and rhythm, s1 and s2 heart sounds present.  Lungs: Clear lung sounds in all lobes. Respirations equal and unlabored. Abdomen:  +BS, soft, non-tender and non-distended. No rebound or guarding. No HSM or masses noted. Derm: No palmar erythema or jaundice Msk:  Symmetrical without gross deformities. Normal posture. Extremities:  Without edema. Neurologic:  Alert and  oriented x4 Psych:  Alert and cooperative. Normal mood and affect.  Invalid input(s): "6 MONTHS"   ASSESSMENT: Bridget Gay is a 81 y.o. female presenting today as a new patient for dysphagia  Dysphagia with meats and breads for the past few months, occurring maybe once per week, also with breakthrough reflux symptoms 2-3x/week despite BID omeprazole '20mg'$ . No nausea or vomiting, weight and appetite are stable. Will stop omeprazole and start protonix '40mg'$  daily, recommend proceeding with EGD for further evaluation of her dysphagia as we cannot rule out esophageal ring, web, stricture or stenosis, less likely malignancy. She should continue with reflux and chewing precautions. Indications, risks and benefits of procedure discussed in detail with patient. Patient verbalized understanding and is in agreement to proceed with EGD at this time.   I did discuss updating screening colonoscopy with the patient given her increased risks due to father  having CRC, last recommended colonoscopy date was 2019 which patient was lost to follow up for, at this time she does not wish to proceed with any further colonoscopies.   PLAN:  Schedule EGD, ASA II, ENDO 3 2. Stop omeprazole  3. Start Protonix '40mg'$  daily  4. Avoid thicker, drier foods, chewing precautions   All questions were answered, patient verbalized understanding and is in agreement with plan as outlined above.    Follow Up: 3 months   Marcelus Dubberly L. Alver Sorrow, MSN, APRN, AGNP-C Adult-Gerontology Nurse Practitioner [facility]  I have reviewed the note and agree with the APP's assessment as described in this progress note  Maylon Peppers, MD Gastroenterology and Hepatology [facility]

## 2022-07-03 NOTE — Patient Instructions (Signed)
Please stop omeprazole We will start pantoprazole '40mg'$  once daily Continue to avoid thicker, drier foods that cause issues We will schedule you for EGD for further evaluation of your symptoms  Follow up 3 months

## 2022-07-04 ENCOUNTER — Encounter (INDEPENDENT_AMBULATORY_CARE_PROVIDER_SITE_OTHER): Payer: Self-pay

## 2022-07-10 ENCOUNTER — Encounter (INDEPENDENT_AMBULATORY_CARE_PROVIDER_SITE_OTHER): Payer: Self-pay

## 2022-07-10 ENCOUNTER — Other Ambulatory Visit (INDEPENDENT_AMBULATORY_CARE_PROVIDER_SITE_OTHER): Payer: Self-pay

## 2022-07-31 ENCOUNTER — Telehealth (INDEPENDENT_AMBULATORY_CARE_PROVIDER_SITE_OTHER): Payer: Self-pay | Admitting: *Deleted

## 2022-07-31 NOTE — Telephone Encounter (Signed)
Received call patient needed to reschedule procedure. She has been moved to 1/9 at 1:15pm. Aware will send new instructions/pre-op appt.   Also she has a 3 month FU on 1/9 and is needing this rescheduled

## 2022-08-01 ENCOUNTER — Encounter (INDEPENDENT_AMBULATORY_CARE_PROVIDER_SITE_OTHER): Payer: Self-pay | Admitting: *Deleted

## 2022-08-04 ENCOUNTER — Encounter (HOSPITAL_COMMUNITY): Payer: Medicare PPO

## 2022-08-14 ENCOUNTER — Encounter (INDEPENDENT_AMBULATORY_CARE_PROVIDER_SITE_OTHER): Payer: Self-pay | Admitting: Gastroenterology

## 2022-08-28 ENCOUNTER — Ambulatory Visit (INDEPENDENT_AMBULATORY_CARE_PROVIDER_SITE_OTHER): Payer: Medicare PPO | Admitting: Gastroenterology

## 2022-09-21 DIAGNOSIS — N1832 Chronic kidney disease, stage 3b: Secondary | ICD-10-CM | POA: Diagnosis not present

## 2022-09-21 DIAGNOSIS — F32A Depression, unspecified: Secondary | ICD-10-CM | POA: Diagnosis not present

## 2022-09-21 DIAGNOSIS — Z823 Family history of stroke: Secondary | ICD-10-CM | POA: Diagnosis not present

## 2022-09-21 DIAGNOSIS — K219 Gastro-esophageal reflux disease without esophagitis: Secondary | ICD-10-CM | POA: Diagnosis not present

## 2022-09-21 DIAGNOSIS — Z79899 Other long term (current) drug therapy: Secondary | ICD-10-CM | POA: Diagnosis not present

## 2022-09-29 ENCOUNTER — Encounter (HOSPITAL_COMMUNITY): Payer: Self-pay

## 2022-09-29 ENCOUNTER — Encounter (HOSPITAL_COMMUNITY)
Admission: RE | Admit: 2022-09-29 | Discharge: 2022-09-29 | Disposition: A | Discharge status: Home / Self Care | Payer: Medicare PPO | Source: Ambulatory Visit | Attending: Gastroenterology | Admitting: Gastroenterology

## 2022-10-02 ENCOUNTER — Ambulatory Visit: Payer: Medicare PPO | Admitting: Cardiology

## 2022-10-03 ENCOUNTER — Ambulatory Visit (HOSPITAL_COMMUNITY)
Admission: RE | Admit: 2022-10-03 | Discharge: 2022-10-03 | Disposition: A | Discharge status: Home / Self Care | Payer: Medicare PPO | Source: Ambulatory Visit | Attending: Gastroenterology | Admitting: Gastroenterology

## 2022-10-03 ENCOUNTER — Ambulatory Visit (INDEPENDENT_AMBULATORY_CARE_PROVIDER_SITE_OTHER): Payer: Medicare PPO | Admitting: Gastroenterology

## 2022-10-03 ENCOUNTER — Other Ambulatory Visit: Payer: Self-pay

## 2022-10-03 ENCOUNTER — Ambulatory Visit (HOSPITAL_BASED_OUTPATIENT_CLINIC_OR_DEPARTMENT_OTHER): Payer: Medicare PPO | Admitting: Anesthesiology

## 2022-10-03 ENCOUNTER — Encounter (INDEPENDENT_AMBULATORY_CARE_PROVIDER_SITE_OTHER): Payer: Self-pay

## 2022-10-03 ENCOUNTER — Ambulatory Visit (HOSPITAL_COMMUNITY): Payer: Medicare PPO | Admitting: Anesthesiology

## 2022-10-03 ENCOUNTER — Encounter (HOSPITAL_COMMUNITY): Payer: Self-pay | Admitting: Gastroenterology

## 2022-10-03 ENCOUNTER — Encounter (HOSPITAL_COMMUNITY)
Admission: RE | Disposition: A | Discharge status: Home / Self Care | Payer: Self-pay | Source: Ambulatory Visit | Attending: Gastroenterology

## 2022-10-03 DIAGNOSIS — K209 Esophagitis, unspecified without bleeding: Secondary | ICD-10-CM | POA: Diagnosis not present

## 2022-10-03 DIAGNOSIS — F419 Anxiety disorder, unspecified: Secondary | ICD-10-CM | POA: Diagnosis not present

## 2022-10-03 DIAGNOSIS — Z79899 Other long term (current) drug therapy: Secondary | ICD-10-CM | POA: Diagnosis not present

## 2022-10-03 DIAGNOSIS — K2100 Gastro-esophageal reflux disease with esophagitis, without bleeding: Secondary | ICD-10-CM | POA: Insufficient documentation

## 2022-10-03 DIAGNOSIS — H409 Unspecified glaucoma: Secondary | ICD-10-CM | POA: Insufficient documentation

## 2022-10-03 DIAGNOSIS — E785 Hyperlipidemia, unspecified: Secondary | ICD-10-CM | POA: Insufficient documentation

## 2022-10-03 DIAGNOSIS — K259 Gastric ulcer, unspecified as acute or chronic, without hemorrhage or perforation: Secondary | ICD-10-CM

## 2022-10-03 DIAGNOSIS — K222 Esophageal obstruction: Secondary | ICD-10-CM | POA: Insufficient documentation

## 2022-10-03 DIAGNOSIS — K449 Diaphragmatic hernia without obstruction or gangrene: Secondary | ICD-10-CM | POA: Diagnosis not present

## 2022-10-03 DIAGNOSIS — F32A Depression, unspecified: Secondary | ICD-10-CM | POA: Insufficient documentation

## 2022-10-03 DIAGNOSIS — K2950 Unspecified chronic gastritis without bleeding: Secondary | ICD-10-CM | POA: Diagnosis not present

## 2022-10-03 DIAGNOSIS — R1310 Dysphagia, unspecified: Secondary | ICD-10-CM

## 2022-10-03 DIAGNOSIS — K319 Disease of stomach and duodenum, unspecified: Secondary | ICD-10-CM | POA: Diagnosis not present

## 2022-10-03 DIAGNOSIS — K3189 Other diseases of stomach and duodenum: Secondary | ICD-10-CM | POA: Diagnosis not present

## 2022-10-03 HISTORY — PX: ESOPHAGOGASTRODUODENOSCOPY (EGD) WITH PROPOFOL: SHX5813

## 2022-10-03 HISTORY — PX: HEMOSTASIS CLIP PLACEMENT: SHX6857

## 2022-10-03 HISTORY — PX: SAVORY DILATION: SHX5439

## 2022-10-03 HISTORY — PX: BIOPSY: SHX5522

## 2022-10-03 SURGERY — ESOPHAGOGASTRODUODENOSCOPY (EGD) WITH PROPOFOL
Anesthesia: General

## 2022-10-03 MED ORDER — LIDOCAINE HCL (CARDIAC) PF 100 MG/5ML IV SOSY
PREFILLED_SYRINGE | INTRAVENOUS | Status: DC | PRN
  Administered 2022-10-03: 50 mg via INTRATRACHEAL

## 2022-10-03 MED ORDER — PROPOFOL 10 MG/ML IV BOLUS
INTRAVENOUS | Status: DC | PRN
  Administered 2022-10-03: 30 mg via INTRAVENOUS
  Administered 2022-10-03: 70 mg via INTRAVENOUS
  Administered 2022-10-03 (×3): 20 mg via INTRAVENOUS
  Administered 2022-10-03: 10 mg via INTRAVENOUS
  Administered 2022-10-03: 20 mg via INTRAVENOUS

## 2022-10-03 MED ORDER — LACTATED RINGERS IV SOLN: INTRAVENOUS | Status: DC

## 2022-10-03 MED ORDER — PANTOPRAZOLE SODIUM 40 MG PO TBEC: 40.0000 mg | DELAYED_RELEASE_TABLET | Freq: Two times a day (BID) | ORAL | 3 refills | Status: DC

## 2022-10-03 NOTE — Transfer of Care (Signed)
Immediate Anesthesia Transfer of Care Note  Patient: Bridget Gay  Procedure(s) Performed: ESOPHAGOGASTRODUODENOSCOPY (EGD) WITH PROPOFOL BIOPSY SAVORY DILATION HEMOSTASIS CLIP PLACEMENT  Patient Location: Endoscopy Unit  Anesthesia Type:General  Level of Consciousness: awake, alert , oriented, and patient cooperative  Airway & Oxygen Therapy: Patient Spontanous Breathing  Post-op Assessment: Report given to RN, Post -op Vital signs reviewed and stable, and Patient moving all extremities  Post vital signs: Reviewed and stable  Last Vitals:  Vitals Value Taken Time  BP    Temp    Pulse    Resp    SpO2      Last Pain:  Vitals:   10/03/22 0920  TempSrc:   PainSc: 0-No pain      Patients Stated Pain Goal: 5 (67/54/49 2010)  Complications: No notable events documented.

## 2022-10-03 NOTE — H&P (Signed)
Bridget Gay is an 82 y.o. female.   Chief Complaint: dysphagia HPI: Bridget Gay is a 82 y.o. female with past medical history of anxiety, cancer, depression, GERD, glaucoma, HLD, coming for evaluation of dysphagia.  Patient reports a chronic history of recurrent dysphagia for multiple months.  Reports this happens with solid food mostly and has some intermittent episodes of heartburn.  Currently taking pantoprazole 40 mg every day.  Also takes Percocet as needed.  Past Medical History:  Diagnosis Date   Anxiety    Cancer ([place])    Skin   Depression    Disorder of cartilage, unspecified    Fracture of unsp phalanx of finger, subs for fx w routn heal    from a fall this year   Fx ankle    GERD (gastroesophageal reflux disease) 06/05/2017   Glaucoma    Hyperlipidemia    Migraine    Postmenopausal syndrome     Past Surgical History:  Procedure Laterality Date   ABDOMINAL HYSTERECTOMY     COLONOSCOPY N/A 04/10/2013   Procedure: COLONOSCOPY;  Surgeon: Rogene Houston, MD;  Location: [facility];  Service: Endoscopy;  Laterality: N/A;  830   ESOPHAGOGASTRODUODENOSCOPY N/A 06/29/2017   Procedure: ESOPHAGOGASTRODUODENOSCOPY (EGD) WITH  ESOPHAGEAL DILATION;  Surgeon: Rogene Houston, MD;  Location: [facility];  Service: Endoscopy;  Laterality: N/A;  1:00   TONSILLECTOMY     TUBAL LIGATION      Family History  Problem Relation Age of Onset   Depression Mother        wasn't diagnosed    Alzheimer's disease Neg Hx    Dementia Neg Hx    Social History:  reports that she has never smoked. She has never used smokeless tobacco. She reports current alcohol use. She reports that she does not use drugs.  Allergies:  Allergies  Allergen Reactions   Sulfa Antibiotics     unknown    Medications Prior to Admission  Medication Sig Dispense Refill   acetaminophen (TYLENOL) 500 MG tablet Take 500 mg by mouth every 6 (six) hours as needed for moderate pain.     aspirin EC 81 MG tablet  Take 81 mg by mouth daily. Swallow whole.     atorvastatin (LIPITOR) 20 MG tablet Take 20 mg by mouth at bedtime.     cetirizine (ZYRTEC) 10 MG tablet Take 10 mg by mouth daily.     chlordiazePOXIDE (LIBRIUM) 25 MG capsule Take 25 mg by mouth 2 (two) times daily as needed for anxiety.     Cholecalciferol (VITAMIN D) 50 MCG (2000 UT) tablet Take 2,000 Units by mouth daily.     COMBIGAN 0.2-0.5 % ophthalmic solution Place 1 drop into both eyes 2 (two) times daily.     dicyclomine (BENTYL) 10 MG capsule Take 10 mg by mouth 2 (two) times daily.     diphenhydrAMINE-zinc acetate (BENADRYL) cream Apply 1 Application topically daily as needed for itching.     doxycycline (ADOXA) 100 MG tablet Take 100 mg by mouth 2 (two) times daily.     DULoxetine (CYMBALTA) 60 MG capsule Take 60 mg by mouth daily.     Ketotifen Fumarate (ALAWAY OP) Place 1 drop into both eyes daily as needed (allergies).     oxyCODONE-acetaminophen (PERCOCET/ROXICET) 5-325 MG tablet Take 1 tablet by mouth every 6 (six) hours as needed for moderate pain.     pantoprazole (PROTONIX) 40 MG tablet Take 40 mg by mouth daily.  triamcinolone cream (KENALOG) 0.1 % Apply 1 Application topically 3 (three) times daily as needed (rash).      No results found for this or any previous visit (from the past 48 hour(s)). No results found.  Review of Systems  HENT:  Positive for trouble swallowing.   All other systems reviewed and are negative.   Blood pressure 120/64, pulse 69, temperature 98.2 F (36.8 C), temperature source Oral, resp. rate 17, height '5\' 1"'$  (1.549 m), weight 67.1 kg, SpO2 98 %. Physical Exam  GENERAL: The patient is AO x3, in no acute distress. HEENT: Head is normocephalic and atraumatic. EOMI are intact. Mouth is well hydrated and without lesions. NECK: Supple. No masses LUNGS: Clear to auscultation. No presence of rhonchi/wheezing/rales. Adequate chest expansion HEART: RRR, normal s1 and s2. ABDOMEN: Soft, nontender,  no guarding, no peritoneal signs, and nondistended. BS +. No masses. EXTREMITIES: Without any cyanosis, clubbing, rash, lesions or edema. NEUROLOGIC: AOx3, no focal motor deficit. SKIN: no jaundice, no rashes  Assessment/Plan Bridget Gay is a 82 y.o. female with past medical history of anxiety, cancer, depression, GERD, glaucoma, HLD, coming for evaluation of dysphagia.  Will proceed with EGD.  Bridget Quale, MD 10/03/2022, 8:34 AM

## 2022-10-03 NOTE — Discharge Instructions (Addendum)
You are being discharged to home.  Resume your previous diet.  We are waiting for your pathology results.  Your physician has recommended a repeat upper endoscopy in three months for surveillance. Increase pantoprazole to 40 mg twice a day Consider stopping doxycycline if symptoms persist. - Increase pantoprazole to 40 mg twice a day  Restart aspirin in 5 days.

## 2022-10-03 NOTE — Anesthesia Preprocedure Evaluation (Signed)
Anesthesia Evaluation  Patient identified by MRN, date of birth, ID band Patient awake    Reviewed: Allergy & Precautions, H&P , NPO status , Patient's Chart, lab work & pertinent test results, reviewed documented beta blocker date and time   Airway Mallampati: II  TM Distance: >3 FB Neck ROM: full    Dental no notable dental hx.    Pulmonary neg pulmonary ROS   Pulmonary exam normal breath sounds clear to auscultation       Cardiovascular Exercise Tolerance: Good negative cardio ROS  Rhythm:regular Rate:Normal     Neuro/Psych  Headaches PSYCHIATRIC DISORDERS Anxiety Depression    negative neurological ROS  negative psych ROS   GI/Hepatic negative GI ROS, Neg liver ROS,GERD  ,,  Endo/Other  negative endocrine ROS    Renal/GU negative Renal ROS  negative genitourinary   Musculoskeletal   Abdominal   Peds  Hematology negative hematology ROS (+)   Anesthesia Other Findings   Reproductive/Obstetrics negative OB ROS                             Anesthesia Physical Anesthesia Plan  ASA: 2  Anesthesia Plan: General   Post-op Pain Management:    Induction:   PONV Risk Score and Plan: Propofol infusion  Airway Management Planned:   Additional Equipment:   Intra-op Plan:   Post-operative Plan:   Informed Consent: I have reviewed the patients History and Physical, chart, labs and discussed the procedure including the risks, benefits and alternatives for the proposed anesthesia with the patient or authorized representative who has indicated his/her understanding and acceptance.     Dental Advisory Given  Plan Discussed with: CRNA  Anesthesia Plan Comments:        Anesthesia Quick Evaluation

## 2022-10-03 NOTE — Op Note (Addendum)
[facility] Patient Name: Bridget Gay Procedure Date: 10/03/2022 9:11 AM MRN: [id number] Date of Birth: 1941-03-19 Attending MD: Maylon Peppers , , [id number] CSN: [id number] Age: 82 Admit Type: Outpatient Procedure:                Upper GI endoscopy Indications:              Dysphagia Providers:                Maylon Peppers, Crystal Page, [place] Risa Grill, Technician Referring MD:              Medicines:                Monitored Anesthesia Care Complications:            No immediate complications. Estimated Blood Loss:     Estimated blood loss: none. Procedure:                Pre-Anesthesia Assessment:                           - Prior to the procedure, a History and Physical                            was performed, and patient medications, allergies                            and sensitivities were reviewed. The patient's                            tolerance of previous anesthesia was reviewed.                           - The risks and benefits of the procedure and the                            sedation options and risks were discussed with the                            patient. All questions were answered and informed                            consent was obtained.                           After obtaining informed consent, the endoscope was                            passed under direct vision. Throughout the                            procedure, the patient's blood pressure, pulse, and                            oxygen saturations were monitored continuously. The  GIF-H190 ([id number]) scope was introduced through the                            mouth, and advanced to the second part of duodenum.                            The upper GI endoscopy was accomplished without                            difficulty. The patient tolerated the procedure                            well. Scope In: 9:24:21 AM Scope Out:  9:41:24 AM Total Procedure Duration: 0 hours 17 minutes 3 seconds  Findings:      A few benign-appearing, intrinsic moderate (circumferential scarring or       stenosis; an endoscope may pass) stenoses were found in the middle third       of the esophagus. The narrowest stenosis measured 1.4 cm (inner       diameter) x less than one cm (in length). The stenoses were traversed. A       guidewire was placed and the scope was withdrawn. Dilation was performed       with a Savary dilator with mild resistance at 16 mm. The dilation site       was examined following endoscope reinsertion and showed mild mucosal       disruption. Biopsies were obtained from the proximal and distal       esophagus with cold forceps for histology of eosinophilic esophagitis.       Upon reinspection, it was noted that the proximal area of the hiatal       hernia had a iatrogenic superficial injury noty involving deeper layers       but with presence of ongoing bleeding. To repair the defect, the tissue       edges were approximated and two hemostatic clips were successfully       placed. Clip manufacturer: [employer]. There was no bleeding at       the end of the procedure.      LA Grade C (one or more mucosal breaks continuous between tops of 2 or       more mucosal folds, less than 75% circumference) esophagitis with no       bleeding was found at the gastroesophageal junction.      A 4 cm hiatal hernia was present.      One non-bleeding superficial gastric ulcer with a clean ulcer base       (Forrest Class III) was found in the gastric antrum. The lesion was 5 mm       in largest dimension. Biopsies were taken with a cold forceps for       Helicobacter pylori testing.      The examined duodenum was normal. Impression:               - Benign-appearing esophageal stenoses. Dilated.                            Clip manufacturer: [employer].                           -  LA Grade C esophagitis with no  bleeding.                           - 4 cm hiatal hernia. Clips were placed at site of                            hiatal hernia disruption.                           - Non-bleeding gastric ulcer with a clean ulcer                            base (Forrest Class III). Biopsied.                           - Normal examined duodenum.                           - Biopsies were taken with a cold forceps for                            evaluation of eosinophilic esophagitis. Moderate Sedation:      Per Anesthesia Care Recommendation:           - Discharge patient to home (ambulatory).                           - Resume previous diet.                           - Await pathology results.                           - Repeat upper endoscopy in 3 months for                            surveillance.                           - Increase pantoprazole to 40 mg twice a day                           - Consider stopping doxycycline if symptoms persist. Procedure Code(s):        --- Professional ---                           [id number], Esophagogastroduodenoscopy, flexible,                            transoral; with insertion of guide wire followed by                            passage of dilator(s) through esophagus over guide                            wire  11572, 59, Esophagogastroduodenoscopy, flexible,                            transoral; with biopsy, single or multiple Diagnosis Code(s):        --- Professional ---                           K22.2, Esophageal obstruction                           K20.90, Esophagitis, unspecified without bleeding                           K44.9, Diaphragmatic hernia without obstruction or                            gangrene                           K25.9, Gastric ulcer, unspecified as acute or                            chronic, without hemorrhage or perforation                           R13.10, Dysphagia, unspecified CPT copyright 2022 American  Medical Association. All rights reserved. The codes documented in this report are preliminary and upon coder review may  be revised to meet current compliance requirements. Maylon Peppers, MD Maylon Peppers,  10/03/2022 9:54:09 AM This report has been signed electronically. Number of Addenda: 0

## 2022-10-04 LAB — SURGICAL PATHOLOGY

## 2022-10-07 NOTE — Anesthesia Postprocedure Evaluation (Signed)
Anesthesia Post Note  Patient: Bridget Gay  Procedure(s) Performed: ESOPHAGOGASTRODUODENOSCOPY (EGD) WITH PROPOFOL BIOPSY SAVORY DILATION HEMOSTASIS CLIP PLACEMENT  Patient location during evaluation: Phase II Anesthesia Type: General Level of consciousness: awake Pain management: pain level controlled Vital Signs Assessment: post-procedure vital signs reviewed and stable Respiratory status: spontaneous breathing and respiratory function stable Cardiovascular status: blood pressure returned to baseline and stable Postop Assessment: no headache and no apparent nausea or vomiting Anesthetic complications: no Comments: Late entry   No notable events documented.   Last Vitals:  Vitals:   10/03/22 0753 10/03/22 0945  BP: 120/64 137/63  Pulse: 69 76  Resp: 17 (!) 23  Temp: 36.8 C (!) 36.4 C  SpO2: 98% 96%    Last Pain:  Vitals:   10/03/22 0945  TempSrc:   PainSc: 0-No pain                 Louann Sjogren

## 2022-10-09 ENCOUNTER — Encounter (HOSPITAL_COMMUNITY): Payer: Self-pay | Admitting: Gastroenterology

## 2022-11-01 DIAGNOSIS — L570 Actinic keratosis: Secondary | ICD-10-CM | POA: Diagnosis not present

## 2022-11-01 DIAGNOSIS — L308 Other specified dermatitis: Secondary | ICD-10-CM | POA: Diagnosis not present

## 2022-11-01 DIAGNOSIS — C44722 Squamous cell carcinoma of skin of right lower limb, including hip: Secondary | ICD-10-CM | POA: Diagnosis not present

## 2022-11-01 DIAGNOSIS — Z85828 Personal history of other malignant neoplasm of skin: Secondary | ICD-10-CM | POA: Diagnosis not present

## 2022-11-01 DIAGNOSIS — D485 Neoplasm of uncertain behavior of skin: Secondary | ICD-10-CM | POA: Diagnosis not present

## 2022-11-01 DIAGNOSIS — L853 Xerosis cutis: Secondary | ICD-10-CM | POA: Diagnosis not present

## 2022-11-01 DIAGNOSIS — L814 Other melanin hyperpigmentation: Secondary | ICD-10-CM | POA: Diagnosis not present

## 2022-12-12 ENCOUNTER — Encounter (INDEPENDENT_AMBULATORY_CARE_PROVIDER_SITE_OTHER): Payer: Self-pay | Admitting: *Deleted

## 2023-01-02 ENCOUNTER — Telehealth (INDEPENDENT_AMBULATORY_CARE_PROVIDER_SITE_OTHER): Payer: Self-pay | Admitting: Gastroenterology

## 2023-01-02 NOTE — Telephone Encounter (Signed)
Who is your primary care physician: Dr.Roy Fagan  Reasons for the EGD: 3 month EGD recall  Are you diabetic? If yes, Type 1 or Type 2?    No  Do you have a prosthetic or mechanical heart valve? No  Do you have a pacemaker/defibrillator?   No  Have you had endocarditis/atrial fibrillation? No  Have you had joint replacement within the last 12 months?  No  Do you have any history of drugs or alchohol?  No  Do you use supplemental oxygen?  No  Have you had a stroke or heart attack within the last 6 months?No  Do you take weight loss medication? No  For female patients: have you had a hysterectomy?  Yes                                     are you post menopausal?       Yes                                            do you still have your menstrual cycle? No      Do you take any blood-thinning medications such as: (aspirin, warfarin, Plavix, Aggrenox)  yes  If yes we need the name, milligram, dosage and who is prescribing doctor Aspirin 81 mg Current Outpatient Medications on File Prior to Visit  Medication Sig Dispense Refill   acetaminophen (TYLENOL) 500 MG tablet Take 500 mg by mouth every 6 (six) hours as needed for moderate pain.     aspirin EC 81 MG tablet Take 81 mg by mouth daily. Swallow whole.     atorvastatin (LIPITOR) 20 MG tablet Take 20 mg by mouth at bedtime.     cetirizine (ZYRTEC) 10 MG tablet Take 10 mg by mouth daily.     chlordiazePOXIDE (LIBRIUM) 25 MG capsule Take 25 mg by mouth 2 (two) times daily as needed for anxiety.     Cholecalciferol (VITAMIN D) 50 MCG (2000 UT) tablet Take 2,000 Units by mouth daily.     COMBIGAN 0.2-0.5 % ophthalmic solution Place 1 drop into both eyes 2 (two) times daily.     dicyclomine (BENTYL) 10 MG capsule Take 10 mg by mouth 2 (two) times daily.     diphenhydrAMINE-zinc acetate (BENADRYL) cream Apply 1 Application topically daily as needed for itching.     doxycycline (ADOXA) 100 MG tablet Take 100 mg by mouth 2 (two) times  daily.     DULoxetine (CYMBALTA) 60 MG capsule Take 60 mg by mouth daily.     Ketotifen Fumarate (ALAWAY OP) Place 1 drop into both eyes daily as needed (allergies).     oxyCODONE-acetaminophen (PERCOCET/ROXICET) 5-325 MG tablet Take 1 tablet by mouth every 6 (six) hours as needed for moderate pain.     pantoprazole (PROTONIX) 40 MG tablet Take 1 tablet (40 mg total) by mouth 2 (two) times daily. 180 tablet 3   triamcinolone cream (KENALOG) 0.1 % Apply 1 Application topically 3 (three) times daily as needed (rash).     No current facility-administered medications on file prior to visit.    Allergies  Allergen Reactions   Sulfa Antibiotics     unknown     Pharmacy: [facility]  Primary Insurance Name: [place]  Best number where you can be  reached: [phone]

## 2023-01-02 NOTE — Telephone Encounter (Signed)
Room 3  Thanks 

## 2023-01-03 NOTE — Telephone Encounter (Signed)
Left message to return call 

## 2023-01-10 DIAGNOSIS — Z79899 Other long term (current) drug therapy: Secondary | ICD-10-CM | POA: Diagnosis not present

## 2023-01-10 DIAGNOSIS — F329 Major depressive disorder, single episode, unspecified: Secondary | ICD-10-CM | POA: Diagnosis not present

## 2023-01-10 DIAGNOSIS — I6300 Cerebral infarction due to thrombosis of unspecified precerebral artery: Secondary | ICD-10-CM | POA: Diagnosis not present

## 2023-01-10 DIAGNOSIS — N1832 Chronic kidney disease, stage 3b: Secondary | ICD-10-CM | POA: Diagnosis not present

## 2023-01-10 NOTE — Telephone Encounter (Signed)
Left message to return call; will send letter

## 2023-02-06 NOTE — Telephone Encounter (Signed)
Pt left message stating that she had not received a call back and she sent her paper work in 2 weeks ago. Pt states she was to have a EGD in 3 months and its going on 4.  Attempted to reach pt twice (01/03/23 and 01/10/23;then sent letter)  Returned call to patient. EGD scheduled for 03/22/23 at 1:45pm. Will mail instructions and pre op once received.

## 2023-02-06 NOTE — Telephone Encounter (Signed)
No PA required via [employer]

## 2023-02-08 ENCOUNTER — Encounter (INDEPENDENT_AMBULATORY_CARE_PROVIDER_SITE_OTHER): Payer: Self-pay

## 2023-02-26 DIAGNOSIS — L814 Other melanin hyperpigmentation: Secondary | ICD-10-CM | POA: Diagnosis not present

## 2023-02-26 DIAGNOSIS — D1801 Hemangioma of skin and subcutaneous tissue: Secondary | ICD-10-CM | POA: Diagnosis not present

## 2023-02-26 DIAGNOSIS — Z85828 Personal history of other malignant neoplasm of skin: Secondary | ICD-10-CM | POA: Diagnosis not present

## 2023-02-26 DIAGNOSIS — L821 Other seborrheic keratosis: Secondary | ICD-10-CM | POA: Diagnosis not present

## 2023-02-26 DIAGNOSIS — L570 Actinic keratosis: Secondary | ICD-10-CM | POA: Diagnosis not present

## 2023-02-26 DIAGNOSIS — L309 Dermatitis, unspecified: Secondary | ICD-10-CM | POA: Diagnosis not present

## 2023-03-06 DIAGNOSIS — L299 Pruritus, unspecified: Secondary | ICD-10-CM | POA: Diagnosis not present

## 2023-03-06 DIAGNOSIS — J069 Acute upper respiratory infection, unspecified: Secondary | ICD-10-CM | POA: Diagnosis not present

## 2023-03-20 ENCOUNTER — Encounter (HOSPITAL_COMMUNITY)
Admission: RE | Admit: 2023-03-20 | Discharge: 2023-03-20 | Disposition: A | Discharge status: Home / Self Care | Payer: Medicare PPO | Source: Ambulatory Visit | Attending: Gastroenterology | Admitting: Gastroenterology

## 2023-03-22 ENCOUNTER — Ambulatory Visit (HOSPITAL_COMMUNITY): Payer: Medicare PPO | Admitting: Certified Registered"

## 2023-03-22 ENCOUNTER — Ambulatory Visit (HOSPITAL_COMMUNITY)
Admission: RE | Admit: 2023-03-22 | Discharge: 2023-03-22 | Disposition: A | Discharge status: Home / Self Care | Payer: Medicare PPO | Attending: Gastroenterology | Admitting: Gastroenterology

## 2023-03-22 ENCOUNTER — Encounter (HOSPITAL_COMMUNITY)
Admission: RE | Disposition: A | Discharge status: Home / Self Care | Payer: Self-pay | Source: Home / Self Care | Attending: Gastroenterology

## 2023-03-22 DIAGNOSIS — K222 Esophageal obstruction: Secondary | ICD-10-CM | POA: Insufficient documentation

## 2023-03-22 DIAGNOSIS — F32A Depression, unspecified: Secondary | ICD-10-CM | POA: Diagnosis not present

## 2023-03-22 DIAGNOSIS — K219 Gastro-esophageal reflux disease without esophagitis: Secondary | ICD-10-CM | POA: Insufficient documentation

## 2023-03-22 DIAGNOSIS — K449 Diaphragmatic hernia without obstruction or gangrene: Secondary | ICD-10-CM

## 2023-03-22 DIAGNOSIS — R1310 Dysphagia, unspecified: Secondary | ICD-10-CM | POA: Insufficient documentation

## 2023-03-22 DIAGNOSIS — E785 Hyperlipidemia, unspecified: Secondary | ICD-10-CM | POA: Insufficient documentation

## 2023-03-22 DIAGNOSIS — Z85828 Personal history of other malignant neoplasm of skin: Secondary | ICD-10-CM | POA: Insufficient documentation

## 2023-03-22 DIAGNOSIS — F419 Anxiety disorder, unspecified: Secondary | ICD-10-CM | POA: Insufficient documentation

## 2023-03-22 DIAGNOSIS — R519 Headache, unspecified: Secondary | ICD-10-CM | POA: Insufficient documentation

## 2023-03-22 HISTORY — PX: ESOPHAGOGASTRODUODENOSCOPY (EGD) WITH PROPOFOL: SHX5813

## 2023-03-22 HISTORY — PX: SAVORY DILATION: SHX5439

## 2023-03-22 SURGERY — ESOPHAGOGASTRODUODENOSCOPY (EGD) WITH PROPOFOL
Anesthesia: General

## 2023-03-22 MED ORDER — PROPOFOL 10 MG/ML IV BOLUS
INTRAVENOUS | Status: DC | PRN
  Administered 2023-03-22: 70 mg via INTRAVENOUS

## 2023-03-22 MED ORDER — LIDOCAINE HCL (PF) 2 % IJ SOLN
INTRAMUSCULAR | Status: AC
  Filled 2023-03-22: qty 10

## 2023-03-22 MED ORDER — PROPOFOL 500 MG/50ML IV EMUL
INTRAVENOUS | Status: DC | PRN
  Administered 2023-03-22: 150 ug/kg/min via INTRAVENOUS

## 2023-03-22 MED ORDER — LACTATED RINGERS IV SOLN: INTRAVENOUS | Status: DC | PRN

## 2023-03-22 MED ORDER — LIDOCAINE HCL (CARDIAC) PF 100 MG/5ML IV SOSY
PREFILLED_SYRINGE | INTRAVENOUS | Status: DC | PRN
  Administered 2023-03-22: 80 mg via INTRAVENOUS

## 2023-03-22 MED ORDER — PROPOFOL 500 MG/50ML IV EMUL
INTRAVENOUS | Status: AC
  Filled 2023-03-22: qty 50

## 2023-03-22 NOTE — Discharge Instructions (Signed)
You are being discharged to home.  Resume your previous diet.  Continue your present medications. If persistent issues with swallowing, may consider referral for hiatal hernia repair.

## 2023-03-22 NOTE — Anesthesia Preprocedure Evaluation (Signed)
Anesthesia Evaluation  Patient identified by MRN, date of birth, ID band Patient awake    Reviewed: Allergy & Precautions, H&P , NPO status , Patient's Chart, lab work & pertinent test results, reviewed documented beta blocker date and time   Airway Mallampati: II  TM Distance: >3 FB Neck ROM: full    Dental no notable dental hx.    Pulmonary neg pulmonary ROS   Pulmonary exam normal breath sounds clear to auscultation       Cardiovascular Exercise Tolerance: Good negative cardio ROS  Rhythm:regular Rate:Normal     Neuro/Psych  Headaches PSYCHIATRIC DISORDERS Anxiety Depression    negative neurological ROS  negative psych ROS   GI/Hepatic negative GI ROS, Neg liver ROS,GERD  ,,  Endo/Other  negative endocrine ROS    Renal/GU negative Renal ROS  negative genitourinary   Musculoskeletal   Abdominal   Peds  Hematology negative hematology ROS (+)   Anesthesia Other Findings   Reproductive/Obstetrics negative OB ROS                             Anesthesia Physical Anesthesia Plan  ASA: 2  Anesthesia Plan: General   Post-op Pain Management:    Induction:   PONV Risk Score and Plan:   Airway Management Planned:   Additional Equipment:   Intra-op Plan:   Post-operative Plan:   Informed Consent: I have reviewed the patients History and Physical, chart, labs and discussed the procedure including the risks, benefits and alternatives for the proposed anesthesia with the patient or authorized representative who has indicated his/her understanding and acceptance.     Dental Advisory Given  Plan Discussed with: CRNA  Anesthesia Plan Comments:        Anesthesia Quick Evaluation

## 2023-03-22 NOTE — Op Note (Signed)
[facility] Patient Name: Bridget Gay Procedure Date: 03/22/2023 12:35 PM MRN: [id number] Date of Birth: 1941-06-23 Attending MD: Katrinka Blazing , , [id number] CSN: [id number] Age: 82 Admit Type: Outpatient Procedure:                Upper GI endoscopy Indications:              Dysphagia, Follow-up of esophageal stenosis Providers:                Katrinka Blazing, Angelica Ran, Zena Amos Referring MD:              Medicines:                Monitored Anesthesia Care Complications:            No immediate complications. Estimated Blood Loss:     Estimated blood loss: none. Procedure:                Pre-Anesthesia Assessment:                           - Prior to the procedure, a History and Physical                            was performed, and patient medications, allergies                            and sensitivities were reviewed. The patient's                            tolerance of previous anesthesia was reviewed.                           - The risks and benefits of the procedure and the                            sedation options and risks were discussed with the                            patient. All questions were answered and informed                            consent was obtained.                           - ASA Grade Assessment: II - A patient with mild                            systemic disease.                           After obtaining informed consent, the endoscope was                            passed under direct vision. Throughout the                            procedure, the patient's  blood pressure, pulse, and                            oxygen saturations were monitored continuously. The                            [id number]-H190 ([id number]) scope was introduced through the                            mouth, and advanced to the second part of duodenum.                            The upper GI endoscopy was accomplished without                             difficulty. The patient tolerated the procedure                            well. Scope In: 1:23:51 PM Scope Out: 1:29:19 PM Total Procedure Duration: 0 hours 5 minutes 28 seconds  Findings:      Two benign-appearing, intrinsic mild (non-circumferential scarring)       stenoses were found in the lower third of the esophagus. The narrowest       stenosis measured 1.8 cm (inner diameter) x less than one cm (in       length). The stenoses were traversed. A guidewire was placed and the       scope was withdrawn. Dilation was performed with a Savary dilator with       mild resistance at 20 mm. The esophagus was examined following endoscope       reinsertion and showed moderate mucosal disruption in the upper third of       the esophagus.      An 8 cm hiatal hernia was found. The hiatal narrowing was 40 cm from the       incisors. The Z-line was 32 cm from the incisors.      The exam of the stomach was otherwise normal.      The examined duodenum was normal. Impression:               - Benign-appearing esophageal stenoses. Dilated.                           - 8 cm hiatal hernia.                           - Normal examined duodenum.                           - No specimens collected. Moderate Sedation:      Per Anesthesia Care Recommendation:           - Discharge patient to home (ambulatory).                           - Resume previous diet.                           - Continue present  medications.                           - If persistent issues with swallowing, may                            consider referral for hiatal hernia repair. Procedure Code(s):        --- Professional ---                           [phone], Esophagogastroduodenoscopy, flexible,                            transoral; with insertion of guide wire followed by                            passage of dilator(s) through esophagus over guide                            wire Diagnosis Code(s):        --- Professional ---                            K22.2, Esophageal obstruction                           K44.9, Diaphragmatic hernia without obstruction or                            gangrene                           R13.10, Dysphagia, unspecified CPT copyright 2022 [employer]. All rights reserved. The codes documented in this report are preliminary and upon coder review may  be revised to meet current compliance requirements. Katrinka Blazing, MD Katrinka Blazing,  03/22/2023 1:38:25 PM This report has been signed electronically. Number of Addenda: 0

## 2023-03-22 NOTE — H&P (Signed)
NILDA KEATHLEY is an 82 y.o. female.   Chief Complaint: dysphagia and history of esophageal strictures HPI: LOUETTA HOLLINGSHEAD is a 82 y.o. female with past medical history of anxiety, cancer, depression, GERD, glaucoma, HLD, coming for evaluation of dysphagia.   Patient has presented persistent dysphagia despite most recent dilation.  Denies any fever, chills, nausea or vomiting.    Past Medical History:  Diagnosis Date   Anxiety    Cancer (HCC)    Skin   Depression    Disorder of cartilage, unspecified    Fracture of unsp phalanx of finger, subs for fx w routn heal    from a fall this year   Fx ankle    GERD (gastroesophageal reflux disease) 06/05/2017   Glaucoma    Hyperlipidemia    Migraine    Postmenopausal syndrome     Past Surgical History:  Procedure Laterality Date   ABDOMINAL HYSTERECTOMY     BIOPSY  10/03/2022   Procedure: BIOPSY;  Surgeon: Dolores Frame, MD;  Location: [facility];  Service: Gastroenterology;;   COLONOSCOPY N/A 04/10/2013   Procedure: COLONOSCOPY;  Surgeon: Malissa Hippo, MD;  [facility]: [facility];  Service: Endoscopy;  Laterality: N/A;  830   ESOPHAGOGASTRODUODENOSCOPY N/A 06/29/2017   Procedure: ESOPHAGOGASTRODUODENOSCOPY (EGD) WITH  ESOPHAGEAL DILATION;  Surgeon: Malissa Hippo, MD;  Location: [facility];  Service: Endoscopy;  Laterality: N/A;  1:00   ESOPHAGOGASTRODUODENOSCOPY (EGD) WITH PROPOFOL N/A 10/03/2022   Procedure: ESOPHAGOGASTRODUODENOSCOPY (EGD) WITH PROPOFOL;  Surgeon: Dolores Frame, MD;  Location: [facility];  Service: Gastroenterology;  Laterality: N/A;  815 ASA 2, pt knows to arrive at 7:00   HEMOSTASIS CLIP PLACEMENT  10/03/2022   Procedure: HEMOSTASIS CLIP PLACEMENT;  Surgeon: Dolores Frame, MD;  Location: [facility];  Service: Gastroenterology;;   Gaspar Bidding DILATION  10/03/2022   Procedure: Gaspar Bidding DILATION;  Surgeon: Marguerita Merles, Reuel Boom, MD;  Location: [facility];  Service:  Gastroenterology;;   TONSILLECTOMY     TUBAL LIGATION      Family History  Problem Relation Age of Onset   Depression Mother        wasn't diagnosed    Alzheimer's disease Neg Hx    Dementia Neg Hx    Social History:  reports that she has never smoked. She has never used smokeless tobacco. She reports current alcohol use. She reports that she does not use drugs.  Allergies:  Allergies  Allergen Reactions   Sulfa Antibiotics     unknown    Medications Prior to Admission  Medication Sig Dispense Refill   acetaminophen (TYLENOL) 500 MG tablet Take 500 mg by mouth every 6 (six) hours as needed for moderate pain.     aspirin EC 81 MG tablet Take 81 mg by mouth daily. Swallow whole.     atorvastatin (LIPITOR) 20 MG tablet Take 20 mg by mouth at bedtime.     cetirizine (ZYRTEC) 10 MG tablet Take 10 mg by mouth daily.     chlordiazePOXIDE (LIBRIUM) 25 MG capsule Take 25 mg by mouth 2 (two) times daily as needed for anxiety.     Cholecalciferol (VITAMIN D) 50 MCG (2000 UT) tablet Take 2,000 Units by mouth daily.     COMBIGAN 0.2-0.5 % ophthalmic solution Place 1 drop into both eyes 2 (two) times daily.     dicyclomine (BENTYL) 10 MG capsule Take 10 mg by mouth 2 (two) times daily.     diphenhydrAMINE-zinc acetate (BENADRYL)  cream Apply 1 Application topically daily as needed for itching.     DULoxetine (CYMBALTA) 60 MG capsule Take 60 mg by mouth daily.     ketotifen (ZADITOR) 0.035 % ophthalmic solution Place 1 drop into both eyes daily as needed (allergies).     oxyCODONE-acetaminophen (PERCOCET/ROXICET) 5-325 MG tablet Take 1 tablet by mouth every 6 (six) hours as needed for moderate pain.     pantoprazole (PROTONIX) 40 MG tablet Take 1 tablet (40 mg total) by mouth 2 (two) times daily. 180 tablet 3   triamcinolone cream (KENALOG) 0.1 % Apply 1 Application topically 3 (three) times daily as needed (rash).      No results found for this or any previous visit (from the past 48  hour(s)). No results found.  Review of Systems  HENT:  Positive for trouble swallowing.   All other systems reviewed and are negative.   Blood pressure 134/62, pulse 65, temperature (!) 97.4 F (36.3 C), resp. rate 16, SpO2 100 %. Physical Exam  GENERAL: The patient is AO x3, in no acute distress. HEENT: Head is normocephalic and atraumatic. EOMI are intact. Mouth is well hydrated and without lesions. NECK: Supple. No masses LUNGS: Clear to auscultation. No presence of rhonchi/wheezing/rales. Adequate chest expansion HEART: RRR, normal s1 and s2. ABDOMEN: Soft, nontender, no guarding, no peritoneal signs, and nondistended. BS +. No masses. EXTREMITIES: Without any cyanosis, clubbing, rash, lesions or edema. NEUROLOGIC: AOx3, no focal motor deficit. SKIN: no jaundice, no rashes  Assessment/Plan RENU ASBY is a 82 y.o. female with past medical history of anxiety, cancer, depression, GERD, glaucoma, HLD, coming for evaluation of dysphagia.   Patient has presented persistent dysphagia despite most recent dilation.  Denies any fever, chills, nausea or vomiting.    Dolores Frame, MD 03/22/2023, 12:46 PM

## 2023-03-22 NOTE — Transfer of Care (Addendum)
Immediate Anesthesia Transfer of Care Note  Patient: Bridget Gay  Procedure(s) Performed: ESOPHAGOGASTRODUODENOSCOPY (EGD) WITH PROPOFOL SAVORY DILATION  Patient Location: PACU  Anesthesia Type:General  Level of Consciousness: awake and patient cooperative  Airway & Oxygen Therapy: Patient Spontanous Breathing  Post-op Assessment: Report given to RN and Post -op Vital signs reviewed and stable  Post vital signs: Reviewed and stable  Last Vitals:  Vitals Value Taken Time  BP 127/90 03/22/23   1336  Temp 36.5 03/22/23   1336  Pulse 74 03/22/23   1336  Resp 20 03/22/23   1336  SpO2 98% 03/22/23   1336    Last Pain:  Vitals:   03/22/23 1319  PainSc: 0-No pain         Complications: No notable events documented.

## 2023-03-26 NOTE — Anesthesia Postprocedure Evaluation (Signed)
Anesthesia Post Note  Patient: Bridget Gay  Procedure(s) Performed: ESOPHAGOGASTRODUODENOSCOPY (EGD) WITH PROPOFOL SAVORY DILATION  Patient location during evaluation: Phase II Anesthesia Type: General Level of consciousness: awake Pain management: pain level controlled Vital Signs Assessment: post-procedure vital signs reviewed and stable Respiratory status: spontaneous breathing and respiratory function stable Cardiovascular status: blood pressure returned to baseline and stable Postop Assessment: no headache and no apparent nausea or vomiting Anesthetic complications: no Comments: Late entry   No notable events documented.   Last Vitals:  Vitals:   03/22/23 1100 03/22/23 1336  BP:  129/70  Pulse: 65 75  Resp: 16 20  Temp:  36.5 C  SpO2: 100% 98%    Last Pain:  Vitals:   03/23/23 1340  PainSc: 0-No pain                 Windell Norfolk

## 2023-03-28 ENCOUNTER — Encounter (HOSPITAL_COMMUNITY): Payer: Self-pay | Admitting: Gastroenterology

## 2023-04-12 DIAGNOSIS — H04123 Dry eye syndrome of bilateral lacrimal glands: Secondary | ICD-10-CM | POA: Diagnosis not present

## 2023-04-12 DIAGNOSIS — H35413 Lattice degeneration of retina, bilateral: Secondary | ICD-10-CM | POA: Diagnosis not present

## 2023-04-12 DIAGNOSIS — H43811 Vitreous degeneration, right eye: Secondary | ICD-10-CM | POA: Diagnosis not present

## 2023-04-12 DIAGNOSIS — H401113 Primary open-angle glaucoma, right eye, severe stage: Secondary | ICD-10-CM | POA: Diagnosis not present

## 2023-04-12 DIAGNOSIS — H18421 Band keratopathy, right eye: Secondary | ICD-10-CM | POA: Diagnosis not present

## 2023-05-30 DIAGNOSIS — H401113 Primary open-angle glaucoma, right eye, severe stage: Secondary | ICD-10-CM | POA: Diagnosis not present

## 2023-05-30 DIAGNOSIS — H1045 Other chronic allergic conjunctivitis: Secondary | ICD-10-CM | POA: Diagnosis not present

## 2023-06-01 DIAGNOSIS — N1832 Chronic kidney disease, stage 3b: Secondary | ICD-10-CM | POA: Diagnosis not present

## 2023-06-01 DIAGNOSIS — I6300 Cerebral infarction due to thrombosis of unspecified precerebral artery: Secondary | ICD-10-CM | POA: Diagnosis not present

## 2023-06-01 DIAGNOSIS — F329 Major depressive disorder, single episode, unspecified: Secondary | ICD-10-CM | POA: Diagnosis not present

## 2023-06-01 DIAGNOSIS — K219 Gastro-esophageal reflux disease without esophagitis: Secondary | ICD-10-CM | POA: Diagnosis not present

## 2023-06-08 DIAGNOSIS — N183 Chronic kidney disease, stage 3 unspecified: Secondary | ICD-10-CM | POA: Diagnosis not present

## 2023-06-08 DIAGNOSIS — F325 Major depressive disorder, single episode, in full remission: Secondary | ICD-10-CM | POA: Diagnosis not present

## 2023-06-08 DIAGNOSIS — M7062 Trochanteric bursitis, left hip: Secondary | ICD-10-CM | POA: Diagnosis not present

## 2023-06-10 IMAGING — MG MM DIGITAL SCREENING BILAT W/ TOMO AND CAD
8 series · 8 of 24 positions shown · non-contrast
Comparison: Previous exam(s).

CLINICAL DATA: Screening.

EXAM:
DIGITAL SCREENING BILATERAL MAMMOGRAM WITH TOMOSYNTHESIS AND CAD
TECHNIQUE: Bilateral screening digital craniocaudal and mediolateral oblique
mammograms were obtained. Bilateral screening digital breast
tomosynthesis was performed. The images were evaluated with
computer-aided detection.

[R CC synth-2D]
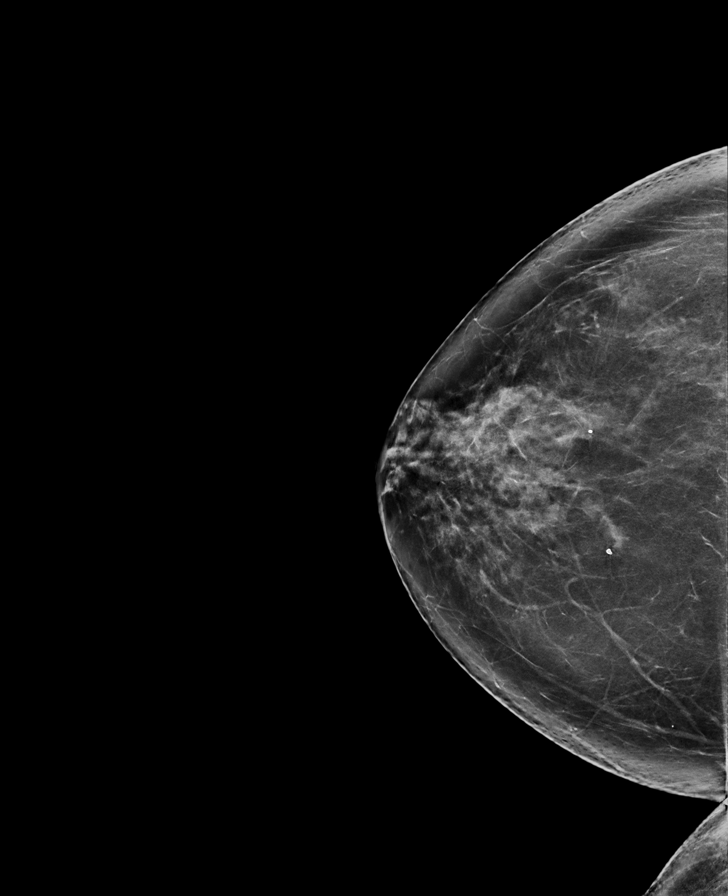

[L CC synth-2D]
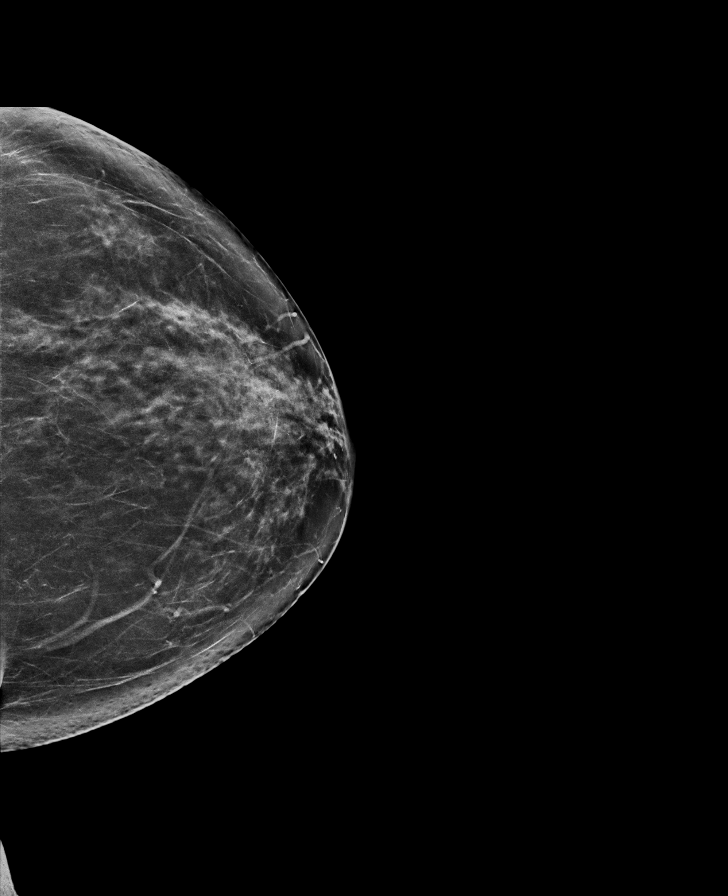

[L MLO synth-2D]
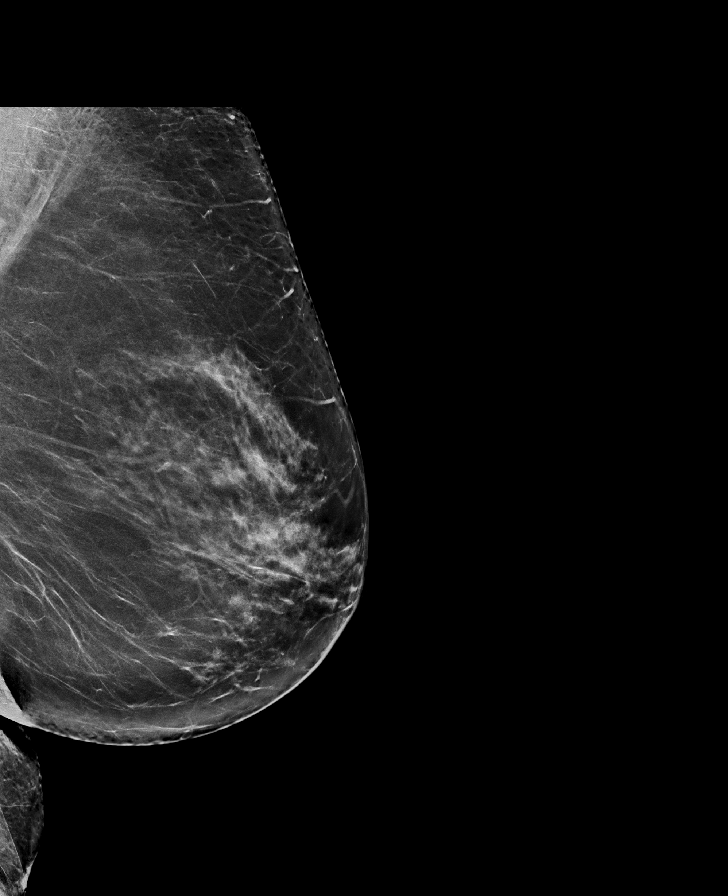

[R MLO synth-2D]
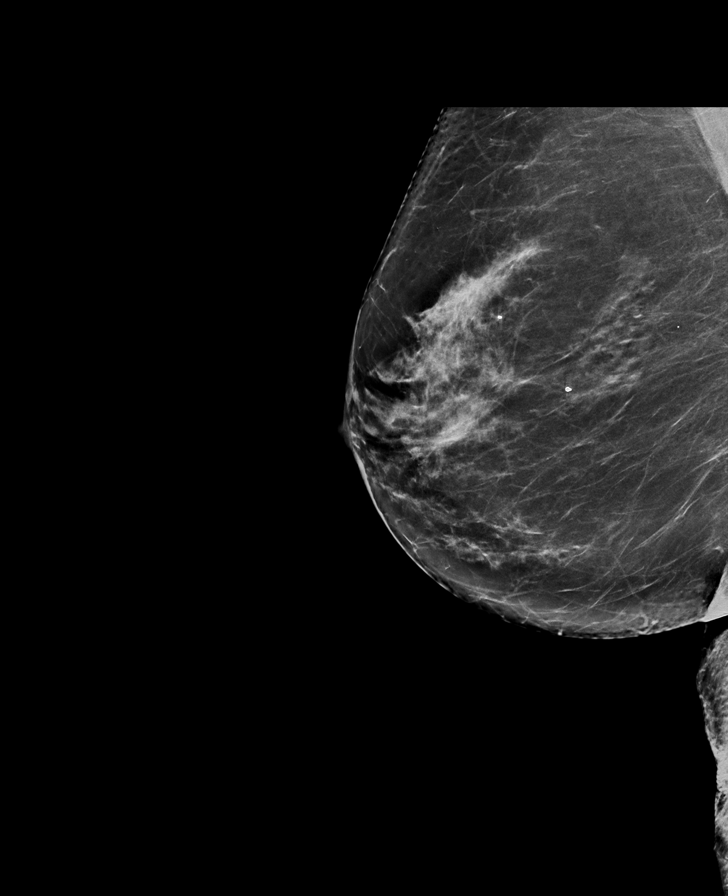

[L MLO tomo · tomo slice 41/82.0]
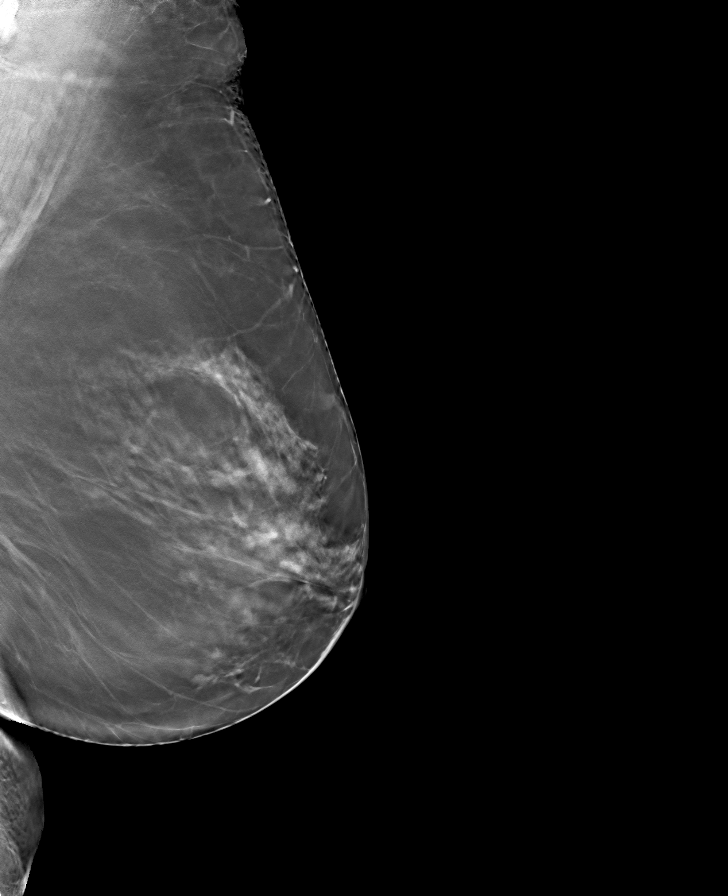

[R MLO tomo · tomo slice 41/82.0]
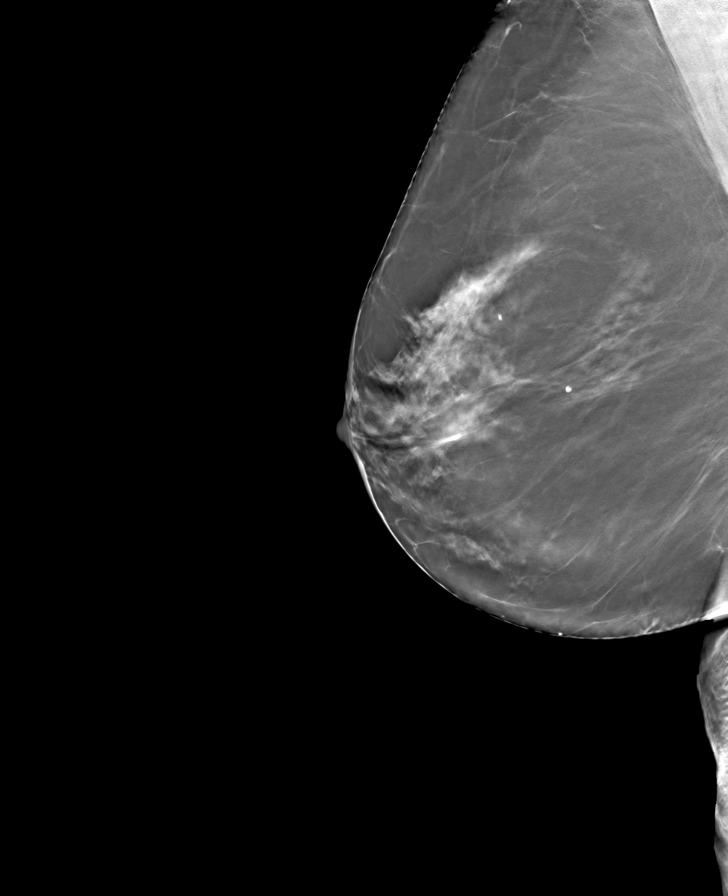

[L CC tomo · tomo slice 39/76.0]
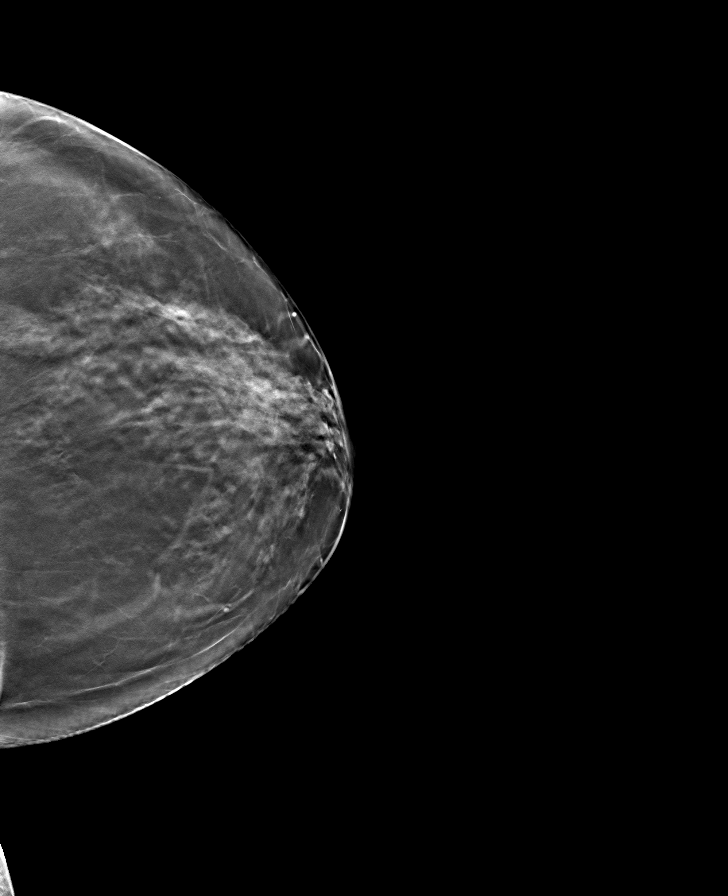

[R CC tomo · tomo slice 42/83.0]
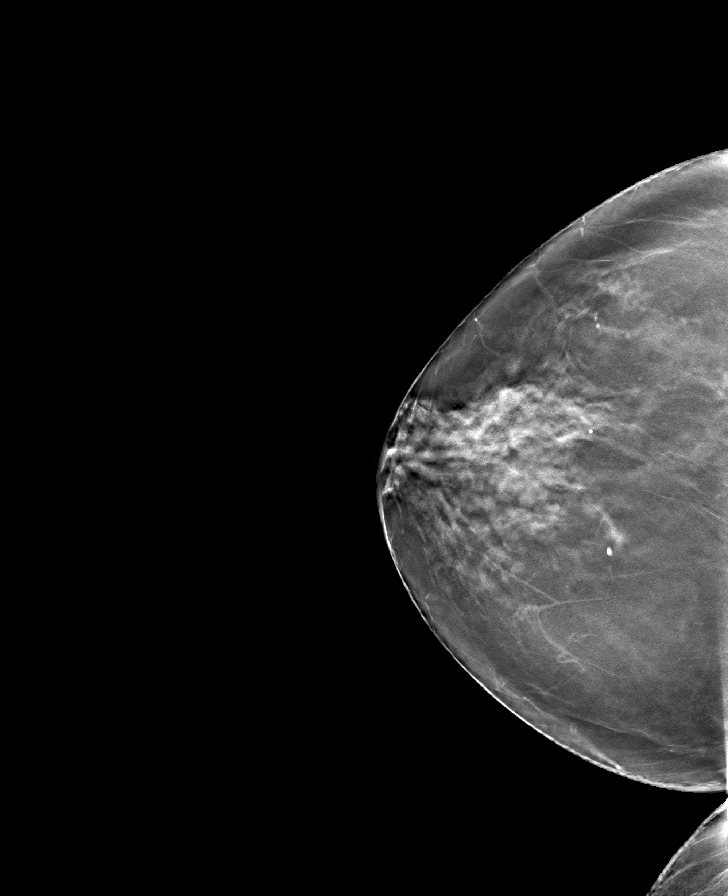

[8 of 24 positions shown; findings below may reference images not displayed]

ACR Breast Density Category c: The breast tissue is heterogeneously
dense, which may obscure small masses.
FINDINGS: There are no findings suspicious for malignancy.
IMPRESSION: No mammographic evidence of malignancy. A result letter of this
screening mammogram will be mailed directly to the patient.

RECOMMENDATION:
Screening mammogram in one year. (Code:[id number])

BI-RADS CATEGORY  1: Negative.

## 2023-07-09 DIAGNOSIS — H35413 Lattice degeneration of retina, bilateral: Secondary | ICD-10-CM | POA: Diagnosis not present

## 2023-07-09 DIAGNOSIS — H1045 Other chronic allergic conjunctivitis: Secondary | ICD-10-CM | POA: Diagnosis not present

## 2023-07-09 DIAGNOSIS — Z961 Presence of intraocular lens: Secondary | ICD-10-CM | POA: Diagnosis not present

## 2023-07-09 DIAGNOSIS — H43811 Vitreous degeneration, right eye: Secondary | ICD-10-CM | POA: Diagnosis not present

## 2023-07-09 DIAGNOSIS — H18421 Band keratopathy, right eye: Secondary | ICD-10-CM | POA: Diagnosis not present

## 2023-07-09 DIAGNOSIS — H04123 Dry eye syndrome of bilateral lacrimal glands: Secondary | ICD-10-CM | POA: Diagnosis not present

## 2023-07-09 DIAGNOSIS — H401113 Primary open-angle glaucoma, right eye, severe stage: Secondary | ICD-10-CM | POA: Diagnosis not present

## 2023-07-18 DIAGNOSIS — M7062 Trochanteric bursitis, left hip: Secondary | ICD-10-CM | POA: Diagnosis not present

## 2023-07-25 DIAGNOSIS — H401113 Primary open-angle glaucoma, right eye, severe stage: Secondary | ICD-10-CM | POA: Diagnosis not present

## 2023-07-25 DIAGNOSIS — H35413 Lattice degeneration of retina, bilateral: Secondary | ICD-10-CM | POA: Diagnosis not present

## 2023-07-25 DIAGNOSIS — H43811 Vitreous degeneration, right eye: Secondary | ICD-10-CM | POA: Diagnosis not present

## 2023-07-25 DIAGNOSIS — H18421 Band keratopathy, right eye: Secondary | ICD-10-CM | POA: Diagnosis not present

## 2023-07-25 DIAGNOSIS — H1045 Other chronic allergic conjunctivitis: Secondary | ICD-10-CM | POA: Diagnosis not present

## 2023-07-25 DIAGNOSIS — H04123 Dry eye syndrome of bilateral lacrimal glands: Secondary | ICD-10-CM | POA: Diagnosis not present

## 2023-07-25 DIAGNOSIS — Z961 Presence of intraocular lens: Secondary | ICD-10-CM | POA: Diagnosis not present

## 2023-10-08 ENCOUNTER — Other Ambulatory Visit (INDEPENDENT_AMBULATORY_CARE_PROVIDER_SITE_OTHER): Payer: Self-pay | Admitting: Gastroenterology

## 2023-10-08 NOTE — Telephone Encounter (Signed)
Needs office visit for further refills

## 2023-10-31 DIAGNOSIS — N1831 Chronic kidney disease, stage 3a: Secondary | ICD-10-CM | POA: Diagnosis not present

## 2023-10-31 DIAGNOSIS — F329 Major depressive disorder, single episode, unspecified: Secondary | ICD-10-CM | POA: Diagnosis not present

## 2023-10-31 DIAGNOSIS — Z79899 Other long term (current) drug therapy: Secondary | ICD-10-CM | POA: Diagnosis not present

## 2023-11-19 DIAGNOSIS — N3000 Acute cystitis without hematuria: Secondary | ICD-10-CM | POA: Diagnosis not present

## 2023-11-19 DIAGNOSIS — E785 Hyperlipidemia, unspecified: Secondary | ICD-10-CM | POA: Diagnosis not present

## 2023-11-19 DIAGNOSIS — N183 Chronic kidney disease, stage 3 unspecified: Secondary | ICD-10-CM | POA: Diagnosis not present

## 2023-11-19 DIAGNOSIS — F325 Major depressive disorder, single episode, in full remission: Secondary | ICD-10-CM | POA: Diagnosis not present

## 2023-12-05 DIAGNOSIS — H401113 Primary open-angle glaucoma, right eye, severe stage: Secondary | ICD-10-CM | POA: Diagnosis not present

## 2024-01-14 ENCOUNTER — Other Ambulatory Visit (INDEPENDENT_AMBULATORY_CARE_PROVIDER_SITE_OTHER): Payer: Self-pay | Admitting: *Deleted

## 2024-01-14 MED ORDER — PANTOPRAZOLE SODIUM 40 MG PO TBEC: 40.0000 mg | DELAYED_RELEASE_TABLET | Freq: Two times a day (BID) | ORAL | 0 refills | Status: DC

## 2024-02-14 DIAGNOSIS — N1831 Chronic kidney disease, stage 3a: Secondary | ICD-10-CM | POA: Diagnosis not present

## 2024-02-14 DIAGNOSIS — F329 Major depressive disorder, single episode, unspecified: Secondary | ICD-10-CM | POA: Diagnosis not present

## 2024-02-14 DIAGNOSIS — E785 Hyperlipidemia, unspecified: Secondary | ICD-10-CM | POA: Diagnosis not present

## 2024-02-20 DIAGNOSIS — N183 Chronic kidney disease, stage 3 unspecified: Secondary | ICD-10-CM | POA: Diagnosis not present

## 2024-02-20 DIAGNOSIS — R413 Other amnesia: Secondary | ICD-10-CM | POA: Diagnosis not present

## 2024-02-20 DIAGNOSIS — N3941 Urge incontinence: Secondary | ICD-10-CM | POA: Diagnosis not present

## 2024-03-11 ENCOUNTER — Ambulatory Visit (INDEPENDENT_AMBULATORY_CARE_PROVIDER_SITE_OTHER): Admitting: Gastroenterology

## 2024-03-26 DIAGNOSIS — H18421 Band keratopathy, right eye: Secondary | ICD-10-CM | POA: Diagnosis not present

## 2024-03-26 DIAGNOSIS — H1045 Other chronic allergic conjunctivitis: Secondary | ICD-10-CM | POA: Diagnosis not present

## 2024-03-26 DIAGNOSIS — H35413 Lattice degeneration of retina, bilateral: Secondary | ICD-10-CM | POA: Diagnosis not present

## 2024-03-26 DIAGNOSIS — Z961 Presence of intraocular lens: Secondary | ICD-10-CM | POA: Diagnosis not present

## 2024-03-26 DIAGNOSIS — H401113 Primary open-angle glaucoma, right eye, severe stage: Secondary | ICD-10-CM | POA: Diagnosis not present

## 2024-03-26 DIAGNOSIS — H04123 Dry eye syndrome of bilateral lacrimal glands: Secondary | ICD-10-CM | POA: Diagnosis not present

## 2024-03-26 DIAGNOSIS — H43811 Vitreous degeneration, right eye: Secondary | ICD-10-CM | POA: Diagnosis not present

## 2024-04-09 ENCOUNTER — Other Ambulatory Visit (INDEPENDENT_AMBULATORY_CARE_PROVIDER_SITE_OTHER): Payer: Self-pay | Admitting: Gastroenterology

## 2024-04-09 NOTE — Telephone Encounter (Signed)
Will refill medication for 3 months, needs follow up appointment with any provider in order to receive any refills.  Thanks,  Markhi Kleckner Castaneda, MD Gastroenterology and Hepatology [facility]  

## 2024-05-12 DIAGNOSIS — L821 Other seborrheic keratosis: Secondary | ICD-10-CM | POA: Diagnosis not present

## 2024-05-12 DIAGNOSIS — L2989 Other pruritus: Secondary | ICD-10-CM | POA: Diagnosis not present

## 2024-05-12 DIAGNOSIS — Z85828 Personal history of other malignant neoplasm of skin: Secondary | ICD-10-CM | POA: Diagnosis not present

## 2024-05-12 DIAGNOSIS — L570 Actinic keratosis: Secondary | ICD-10-CM | POA: Diagnosis not present

## 2024-05-12 DIAGNOSIS — L812 Freckles: Secondary | ICD-10-CM | POA: Diagnosis not present

## 2024-05-12 DIAGNOSIS — L814 Other melanin hyperpigmentation: Secondary | ICD-10-CM | POA: Diagnosis not present

## 2024-05-19 DIAGNOSIS — E785 Hyperlipidemia, unspecified: Secondary | ICD-10-CM | POA: Diagnosis not present

## 2024-05-19 DIAGNOSIS — F329 Major depressive disorder, single episode, unspecified: Secondary | ICD-10-CM | POA: Diagnosis not present

## 2024-05-19 DIAGNOSIS — N1831 Chronic kidney disease, stage 3a: Secondary | ICD-10-CM | POA: Diagnosis not present

## 2024-06-23 DIAGNOSIS — L2989 Other pruritus: Secondary | ICD-10-CM | POA: Diagnosis not present

## 2024-06-24 ENCOUNTER — Telehealth: Payer: Self-pay | Admitting: Neurology

## 2024-06-24 NOTE — Telephone Encounter (Signed)
 Patient called to reschedule appointment due to provider out.

## 2024-06-25 ENCOUNTER — Ambulatory Visit: Admitting: Neurology

## 2024-07-09 ENCOUNTER — Encounter (INDEPENDENT_AMBULATORY_CARE_PROVIDER_SITE_OTHER): Payer: Self-pay | Admitting: Gastroenterology

## 2024-07-16 ENCOUNTER — Other Ambulatory Visit (INDEPENDENT_AMBULATORY_CARE_PROVIDER_SITE_OTHER): Payer: Self-pay

## 2024-07-16 DIAGNOSIS — K219 Gastro-esophageal reflux disease without esophagitis: Secondary | ICD-10-CM

## 2024-07-16 MED ORDER — PANTOPRAZOLE SODIUM 40 MG PO TBEC
40.0000 mg | DELAYED_RELEASE_TABLET | Freq: Two times a day (BID) | ORAL | 0 refills | Status: AC
Start: 2024-07-16 — End: ?

## 2024-07-17 DIAGNOSIS — N183 Chronic kidney disease, stage 3 unspecified: Secondary | ICD-10-CM | POA: Diagnosis not present

## 2024-07-17 DIAGNOSIS — N3941 Urge incontinence: Secondary | ICD-10-CM | POA: Diagnosis not present

## 2024-07-17 DIAGNOSIS — G629 Polyneuropathy, unspecified: Secondary | ICD-10-CM | POA: Diagnosis not present

## 2024-07-17 DIAGNOSIS — K219 Gastro-esophageal reflux disease without esophagitis: Secondary | ICD-10-CM | POA: Diagnosis not present

## 2024-07-17 DIAGNOSIS — Z23 Encounter for immunization: Secondary | ICD-10-CM | POA: Diagnosis not present

## 2024-09-30 ENCOUNTER — Ambulatory Visit: Admitting: Neurology

## 2025-03-16 ENCOUNTER — Ambulatory Visit: Admitting: Neurology
